# Patient Record
Sex: Male | Born: 1935 | Race: White | Hispanic: No | Smoking: Never smoker
Health system: Southern US, Community
[De-identification: ages and names within clinical notes are randomized; demographics above are authoritative.]

## PROBLEM LIST (undated history)

## (undated) DIAGNOSIS — M545 Low back pain, unspecified: Secondary | ICD-10-CM

## (undated) DIAGNOSIS — M1990 Unspecified osteoarthritis, unspecified site: Secondary | ICD-10-CM

## (undated) DIAGNOSIS — G5600 Carpal tunnel syndrome, unspecified upper limb: Secondary | ICD-10-CM

## (undated) DIAGNOSIS — N200 Calculus of kidney: Secondary | ICD-10-CM

## (undated) DIAGNOSIS — Z8669 Personal history of other diseases of the nervous system and sense organs: Secondary | ICD-10-CM

## (undated) DIAGNOSIS — I2510 Atherosclerotic heart disease of native coronary artery without angina pectoris: Secondary | ICD-10-CM

## (undated) DIAGNOSIS — J668 Airway disease due to other specific organic dusts: Secondary | ICD-10-CM

## (undated) DIAGNOSIS — E7800 Pure hypercholesterolemia, unspecified: Secondary | ICD-10-CM

## (undated) DIAGNOSIS — R7611 Nonspecific reaction to tuberculin skin test without active tuberculosis: Secondary | ICD-10-CM

## (undated) DIAGNOSIS — J302 Other seasonal allergic rhinitis: Secondary | ICD-10-CM

## (undated) DIAGNOSIS — I10 Essential (primary) hypertension: Secondary | ICD-10-CM

## (undated) DIAGNOSIS — C4490 Unspecified malignant neoplasm of skin, unspecified: Secondary | ICD-10-CM

## (undated) HISTORY — DX: Essential (primary) hypertension: I10

## (undated) HISTORY — DX: Airway disease due to other specific organic dusts: J66.8

## (undated) HISTORY — DX: Calculus of kidney: N20.0

## (undated) HISTORY — DX: Other seasonal allergic rhinitis: J30.2

## (undated) HISTORY — DX: Nonspecific reaction to tuberculin skin test without active tuberculosis: R76.11

## (undated) HISTORY — PX: CORONARY ARTERY BYPASS GRAFT: SHX141

## (undated) HISTORY — DX: Atherosclerotic heart disease of native coronary artery without angina pectoris: I25.10

## (undated) HISTORY — DX: Unspecified malignant neoplasm of skin, unspecified: C44.90

## (undated) HISTORY — DX: Low back pain, unspecified: M54.50

## (undated) HISTORY — DX: Unspecified osteoarthritis, unspecified site: M19.90

## (undated) HISTORY — DX: Pure hypercholesterolemia, unspecified: E78.00

## (undated) HISTORY — DX: Carpal tunnel syndrome, unspecified upper limb: G56.00

## (undated) HISTORY — DX: Low back pain: M54.5

## (undated) HISTORY — DX: Personal history of other diseases of the nervous system and sense organs: Z86.69

---

## 1998-01-23 ENCOUNTER — Inpatient Hospital Stay (HOSPITAL_COMMUNITY): Admission: EM | Admit: 1998-01-23 | Discharge: 1998-01-29 | Payer: Self-pay | Admitting: *Deleted

## 1998-01-27 ENCOUNTER — Encounter: Payer: Self-pay | Admitting: Cardiothoracic Surgery

## 1998-01-28 ENCOUNTER — Encounter: Payer: Self-pay | Admitting: Cardiothoracic Surgery

## 2001-05-14 ENCOUNTER — Encounter: Admission: RE | Admit: 2001-05-14 | Discharge: 2001-05-14 | Payer: Self-pay | Admitting: Orthopedic Surgery

## 2001-05-14 ENCOUNTER — Encounter: Payer: Self-pay | Admitting: Orthopedic Surgery

## 2004-06-19 ENCOUNTER — Ambulatory Visit: Payer: Self-pay | Admitting: *Deleted

## 2004-06-21 ENCOUNTER — Ambulatory Visit: Payer: Self-pay | Admitting: Cardiology

## 2004-08-16 ENCOUNTER — Ambulatory Visit: Payer: Self-pay | Admitting: Internal Medicine

## 2004-10-11 ENCOUNTER — Ambulatory Visit: Payer: Self-pay | Admitting: Cardiovascular Disease

## 2005-09-24 ENCOUNTER — Encounter: Payer: Self-pay | Admitting: *Deleted

## 2006-01-15 ENCOUNTER — Ambulatory Visit: Payer: Self-pay | Admitting: Pulmonary Disease

## 2006-02-10 ENCOUNTER — Ambulatory Visit: Payer: Self-pay | Admitting: *Deleted

## 2006-05-07 ENCOUNTER — Ambulatory Visit: Payer: Self-pay | Admitting: Pulmonary Disease

## 2006-11-03 ENCOUNTER — Ambulatory Visit: Payer: Self-pay | Admitting: Pulmonary Disease

## 2006-11-03 LAB — CONVERTED CEMR LAB
Albumin: 4.1 g/dL (ref 3.5–5.2)
Alkaline Phosphatase: 61 units/L (ref 39–117)
BUN: 19 mg/dL (ref 6–23)
Basophils Absolute: 0 10*3/uL (ref 0.0–0.1)
Cholesterol: 303 mg/dL (ref 0–200)
Direct LDL: 184.5 mg/dL
GFR calc Af Amer: 93 mL/min
Hemoglobin: 17.1 g/dL — ABNORMAL HIGH (ref 13.0–17.0)
Lymphocytes Relative: 19.1 % (ref 12.0–46.0)
MCHC: 33.7 g/dL (ref 30.0–36.0)
Monocytes Absolute: 0.4 10*3/uL (ref 0.2–0.7)
Monocytes Relative: 6.7 % (ref 3.0–11.0)
Neutro Abs: 4.6 10*3/uL (ref 1.4–7.7)
Potassium: 4.2 meq/L (ref 3.5–5.1)
TSH: 0.78 microintl units/mL (ref 0.35–5.50)
Total Protein: 7.1 g/dL (ref 6.0–8.3)

## 2006-11-30 ENCOUNTER — Ambulatory Visit: Payer: Self-pay | Admitting: Cardiovascular Disease

## 2007-01-18 ENCOUNTER — Ambulatory Visit: Payer: Self-pay | Admitting: Cardiology

## 2007-01-18 LAB — CONVERTED CEMR LAB
ALT: 16 units/L (ref 0–53)
AST: 23 units/L (ref 0–37)
Albumin: 3.9 g/dL (ref 3.5–5.2)
HDL: 37.8 mg/dL — ABNORMAL LOW (ref >39.0)
Total Bilirubin: 1.4 mg/dL — ABNORMAL HIGH (ref 0.3–1.2)
Triglycerides: 102 mg/dL (ref 0–149)

## 2007-01-28 ENCOUNTER — Ambulatory Visit: Payer: Self-pay | Admitting: Cardiology

## 2007-03-05 DIAGNOSIS — I25810 Atherosclerosis of coronary artery bypass graft(s) without angina pectoris: Secondary | ICD-10-CM

## 2007-03-05 DIAGNOSIS — M545 Low back pain: Secondary | ICD-10-CM

## 2007-03-05 DIAGNOSIS — I10 Essential (primary) hypertension: Secondary | ICD-10-CM | POA: Insufficient documentation

## 2007-03-05 DIAGNOSIS — M1990 Unspecified osteoarthritis, unspecified site: Secondary | ICD-10-CM | POA: Insufficient documentation

## 2007-03-05 DIAGNOSIS — E7800 Pure hypercholesterolemia, unspecified: Secondary | ICD-10-CM

## 2007-03-05 DIAGNOSIS — Z951 Presence of aortocoronary bypass graft: Secondary | ICD-10-CM | POA: Insufficient documentation

## 2007-05-03 ENCOUNTER — Ambulatory Visit: Payer: Self-pay | Admitting: Cardiology

## 2007-05-03 LAB — CONVERTED CEMR LAB
ALT: 19 units/L (ref 0–53)
AST: 25 units/L (ref 0–37)
Alkaline Phosphatase: 52 units/L (ref 39–117)
Bilirubin, Direct: 0.1 mg/dL (ref 0.0–0.3)
Cholesterol: 223 mg/dL (ref 0–200)
VLDL: 28 mg/dL (ref 0–40)

## 2007-05-05 ENCOUNTER — Ambulatory Visit: Payer: Self-pay | Admitting: Pulmonary Disease

## 2007-05-05 DIAGNOSIS — C44702 Unspecified malignant neoplasm of skin of right lower limb, including hip: Secondary | ICD-10-CM

## 2007-05-05 DIAGNOSIS — G5600 Carpal tunnel syndrome, unspecified upper limb: Secondary | ICD-10-CM | POA: Insufficient documentation

## 2007-05-05 DIAGNOSIS — J668 Airway disease due to other specific organic dusts: Secondary | ICD-10-CM | POA: Insufficient documentation

## 2007-05-06 ENCOUNTER — Ambulatory Visit: Payer: Self-pay | Admitting: Cardiology

## 2007-07-23 ENCOUNTER — Ambulatory Visit: Payer: Self-pay | Admitting: Cardiology

## 2007-07-23 LAB — CONVERTED CEMR LAB
AST: 28 units/L (ref 0–37)
Albumin: 3.8 g/dL (ref 3.5–5.2)
Bilirubin, Direct: 0.2 mg/dL (ref 0.0–0.3)
LDL Cholesterol: 117 mg/dL — ABNORMAL HIGH (ref 0–99)
Total CHOL/HDL Ratio: 4.3
Triglycerides: 117 mg/dL (ref 0–149)
VLDL: 23 mg/dL (ref 0–40)

## 2007-07-29 ENCOUNTER — Ambulatory Visit: Payer: Self-pay | Admitting: Cardiovascular Disease

## 2008-01-10 ENCOUNTER — Ambulatory Visit: Payer: Self-pay | Admitting: Internal Medicine

## 2008-01-10 LAB — CONVERTED CEMR LAB
ALT: 15 units/L (ref 0–53)
Albumin: 4 g/dL (ref 3.5–5.2)
Cholesterol: 176 mg/dL (ref 0–200)
HDL: 36.9 mg/dL — ABNORMAL LOW (ref >39.0)
LDL Cholesterol: 110 mg/dL — ABNORMAL HIGH (ref 0–99)
Total Protein: 6.9 g/dL (ref 6.0–8.3)
Triglycerides: 146 mg/dL (ref 0–149)
VLDL: 29 mg/dL (ref 0–40)

## 2008-01-13 ENCOUNTER — Ambulatory Visit: Payer: Self-pay | Admitting: Cardiology

## 2008-01-13 LAB — CONVERTED CEMR LAB
Basophils Absolute: 0 10*3/uL (ref 0.0–0.1)
Basophils Relative: 0.9 % (ref 0.0–3.0)
Hemoglobin: 16.3 g/dL (ref 13.0–17.0)
Lymphocytes Relative: 20.2 % (ref 12.0–46.0)
Monocytes Relative: 7 % (ref 3.0–12.0)
Neutro Abs: 3.8 10*3/uL (ref 1.4–7.7)
Neutrophils Relative %: 71 % (ref 43.0–77.0)
RBC: 5.03 M/uL (ref 4.22–5.81)

## 2008-02-08 ENCOUNTER — Inpatient Hospital Stay (HOSPITAL_COMMUNITY): Admission: EM | Admit: 2008-02-08 | Discharge: 2008-02-10 | Payer: Self-pay | Admitting: Emergency Medicine

## 2008-07-12 ENCOUNTER — Ambulatory Visit: Payer: Self-pay | Admitting: Cardiology

## 2008-07-12 LAB — CONVERTED CEMR LAB
ALT: 15 units/L (ref 0–53)
Bilirubin, Direct: 0.2 mg/dL (ref 0.0–0.3)
Cholesterol: 168 mg/dL (ref 0–200)
HDL: 40.5 mg/dL (ref >39.0)
LDL Cholesterol: 108 mg/dL — ABNORMAL HIGH (ref 0–99)
Total Bilirubin: 1 mg/dL (ref 0.3–1.2)
Total Protein: 7 g/dL (ref 6.0–8.3)
VLDL: 20 mg/dL (ref 0–40)

## 2008-07-17 ENCOUNTER — Ambulatory Visit: Payer: Self-pay | Admitting: Cardiology

## 2009-01-01 ENCOUNTER — Ambulatory Visit: Payer: Self-pay | Admitting: Internal Medicine

## 2009-01-01 LAB — CONVERTED CEMR LAB
AST: 24 units/L (ref 0–37)
Albumin: 4 g/dL (ref 3.5–5.2)
Alkaline Phosphatase: 55 units/L (ref 39–117)
Cholesterol: 166 mg/dL (ref 0–200)
HDL: 36.7 mg/dL — ABNORMAL LOW (ref >39.00)
Total CHOL/HDL Ratio: 5
Total Protein: 7.3 g/dL (ref 6.0–8.3)
Triglycerides: 125 mg/dL (ref 0.0–149.0)

## 2009-01-04 ENCOUNTER — Ambulatory Visit: Payer: Self-pay | Admitting: Internal Medicine

## 2009-06-21 ENCOUNTER — Ambulatory Visit: Payer: Self-pay | Admitting: Internal Medicine

## 2009-06-21 LAB — CONVERTED CEMR LAB
ALT: 15 units/L (ref 0–53)
AST: 23 units/L (ref 0–37)
Alkaline Phosphatase: 53 units/L (ref 39–117)
Bilirubin, Direct: 0.1 mg/dL (ref 0.0–0.3)
Cholesterol: 164 mg/dL (ref 0–200)
Total Bilirubin: 0.8 mg/dL (ref 0.3–1.2)
Total Protein: 7.2 g/dL (ref 6.0–8.3)
VLDL: 34 mg/dL (ref 0.0–40.0)

## 2009-06-28 ENCOUNTER — Ambulatory Visit: Payer: Self-pay | Admitting: Cardiology

## 2009-12-24 ENCOUNTER — Ambulatory Visit: Payer: Self-pay | Admitting: Internal Medicine

## 2009-12-24 LAB — CONVERTED CEMR LAB
ALT: 12 units/L (ref 0–53)
Albumin: 4.1 g/dL (ref 3.5–5.2)
Cholesterol: 190 mg/dL (ref 0–200)
HDL: 41.3 mg/dL (ref >39.00)
Total CHOL/HDL Ratio: 5
Total Protein: 6.8 g/dL (ref 6.0–8.3)
Triglycerides: 142 mg/dL (ref 0.0–149.0)

## 2009-12-27 ENCOUNTER — Ambulatory Visit: Payer: Self-pay | Admitting: Cardiovascular Disease

## 2010-06-25 NOTE — Assessment & Plan Note (Signed)
Summary: lpr..mp   Visit Type:  Follow-up  CC:  dyslipidemia follow-up.  History of Present Illness:  Lipid Clinic Visit      The patient comes in today for dyslipidemia follow-up.  The patient has no history of chest pain, shortness of breath, muscle aches, and muscle cramps.  Dietary compliance review reveals an overall grade of eating 5 or more fruits and vegetables and limiting fats and TFA's.  Risk factor modification shows that the patient is exercising by push mowing multiple family members yards, and cutting wood for neighbors.  Compliance with medication is good.     Lipid Management Provider  Eda Keys, PharmD  Current Medications (verified): 1)  Multivitamins   Tabs (Multiple Vitamin) .... Take 1 Tablet By Mouth Once A Day 2)  Crestor 20 Mg  Tabs (Rosuvastatin Calcium) .... Take 1 Tablet Every Other Day  Allergies (verified): No Known Drug Allergies   Vital Signs:  Patient profile:   77 year old male Height:      71 inches BP sitting:   132 / 82  Impression & Recommendations:  Problem # 1:  HYPERCHOLESTEROLEMIA (ICD-272.0)  His updated medication list for this problem includes:    Crestor 20 Mg Tabs (Rosuvastatin calcium) .Marland Kitchen... Take 1 tablet every other day  Mr Dollar returns to lipid clinic with no compalints.  He is comlpiant with meds and an low fat diet.  He eats mostly baked chicken and fish and occassionally it is fried.  He eats lots of vegies as well.  He does not have a set exercise routine, however, his exercise is push mowing several family members and his churches grass.  Each being failry large lots.  He also chops wood for himself and some neighbors.  He is "always on the go"  at 77yo.    TC 164  at goal < 200   TG 170 > goal < 150 and higher than last at 125,   LDL 86  at goal < 100   HDL 44 at goal > 40  Plan to decrease CHO and continue exercise.  Will monitor TG closely and f/u in 6 months.  Patient did not wish to start fish oil at this time,  but will consider adding this at next visit if TG remain elevated.

## 2010-06-25 NOTE — Assessment & Plan Note (Signed)
Summary: rov/eac   Visit Type:  Follow-up  CC:  dyslipidemia follow-up.  History of Present Illness:  Lipid Clinic Visit      The patient comes in today for dyslipidemia follow-up.  The patient has no complaints of medication problems, chest pain, muscle aches, and muscle cramps.  He is currently compliant with his Crestor 20mg  every other day.  Dietary compliance review reveals pt following a low-fat, low-cholesterol diet.  He eats mostly chicken, vegetables and occassionally potato salad.  Review of exercise habits reveals that the patient continues to push mow his 1 1/2-2 acre yard and chop wood for him and his relatives.  He does not smoke or drink alcohol.     Lipid Management Provider  Weston Brass, PharmD  Current Medications (verified): 1)  Multivitamins   Tabs (Multiple Vitamin) .... Take 1 Tablet By Mouth Once A Day 2)  Crestor 20 Mg  Tabs (Rosuvastatin Calcium) .... Take 1 Tablet Every Other Day  Allergies (verified): No Known Drug Allergies   Vital Signs:  Patient profile:   77 year old male Height:      71 inches Weight:      149.50 pounds BMI:     20.93 BP sitting:   142 / 86 Cuff size:   regular  Impression & Recommendations:  Problem # 1:  HYPERCHOLESTEROLEMIA (ICD-272.0) Pt's current cholesterol is TC- 190  (goal<200), TG 142 (goal<150), HDL 41 (goal>40), and LDL 253 (goal<70).  AST and ALT are WNL.  His TG have improved since last visit but LDL is still above goal.  Will not increase Crestor at this time but if LDL remains elevated at next visit will need to incrse Crestor.  He currently has a healthy diet and maintains a very active lifestyle given his age.  Encouraged him to continue to be as active as possible.  Will recheck cholesterol in 6 months.   His updated medication list for this problem includes:    Crestor 20 Mg Tabs (Rosuvastatin calcium) .Marland Kitchen... Take 1 tablet every other day  Patient Instructions: 1)  Continue to take Crestor 20mg  every other  day 2)  Continue low-cholesterol diet 3)  Try to stay as active as possible by mowing yards and chopping woods 4)  Lab work: 07/01/2009 at 8:30 am (fasting) 5)  Lipid Clinic Appt: 07/04/2009 at 10:00 am

## 2010-07-01 ENCOUNTER — Other Ambulatory Visit: Payer: Self-pay

## 2010-07-02 ENCOUNTER — Other Ambulatory Visit (INDEPENDENT_AMBULATORY_CARE_PROVIDER_SITE_OTHER): Payer: PRIVATE HEALTH INSURANCE

## 2010-07-02 ENCOUNTER — Other Ambulatory Visit: Payer: Self-pay

## 2010-07-02 ENCOUNTER — Encounter (INDEPENDENT_AMBULATORY_CARE_PROVIDER_SITE_OTHER): Payer: Self-pay | Admitting: *Deleted

## 2010-07-02 DIAGNOSIS — E7800 Pure hypercholesterolemia, unspecified: Secondary | ICD-10-CM

## 2010-07-02 DIAGNOSIS — Z79899 Other long term (current) drug therapy: Secondary | ICD-10-CM

## 2010-07-02 LAB — HEPATIC FUNCTION PANEL
ALT: 15 U/L (ref 0–53)
AST: 25 U/L (ref 0–37)
Albumin: 4.2 g/dL (ref 3.5–5.2)
Alkaline Phosphatase: 59 U/L (ref 39–117)
Total Protein: 7 g/dL (ref 6.0–8.3)

## 2010-07-02 LAB — LIPID PANEL
Cholesterol: 169 mg/dL (ref 0–200)
VLDL: 28.4 mg/dL (ref 0.0–40.0)

## 2010-07-04 ENCOUNTER — Ambulatory Visit: Payer: Self-pay

## 2010-10-08 NOTE — Assessment & Plan Note (Signed)
[facility]                               LIPID CLINIC NOTE   NAME:[name], [name]                         MRN:          [id number]  DATE:07/17/2008                            DOB:          Sep 01, 1925    The patient is seen in Lipid Clinic for further evaluation, medication  titration associated with his hyperlipidemia in the setting of  documented coronary disease .  The patient is feeling and doing well  overall.  He has been using medication calendar to remember.  He has  been tolerating his meds and taking them every other day, the Crestor  because he is intolerant to daily therapy.  His daughter cooks some  pork, beef, and other red meats.  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  He is walking or doing  something physical each day.  He did break his leg, which he was knocked  over when he was cutting a tree down 3 or 4 months ago.  He is wearing  compression stockings and continues to complain of arthritis pain.  He  has had an 8-pound weight loss.   PHYSICAL EXAMINATION:  The patient's weight is 151 pounds, blood  pressure is 140/78.  He was talking at the time of blood pressure  reading and 72 is the heart rate.   Labs on July 12, 2008, revealed total cholesterol 168, triglyceride  98, HDL 40.5, LDL 108.  LFTs are within normal limits.   ASSESSMENT:  The patient is a very active 77 year old male, on Crestor  20 mg every other day due to intolerance of daily therapy.  Lipid panel  is good except for slight elevation in LDL.  He seems compliant and has  a system to remember his medications.  He will continue Crestor 20 mg  every other day; take Tylenol 500 one or two tablets every 6-8 hours for  joint pain.  He will call with questions or problems in the meantime.  Meg Conger, Pharm. D  saw the patient in visit today.      Shelby Dubin, PharmD, BCPS, CPP  Electronically Signed      Rollene Rotunda, MD,  [facility]  Electronically Signed   MP/MedQ  DD: 08/03/2008  DT: 08/03/2008  Job #: [id number]   cc:   Veverly Fells. Excell Seltzer, MD

## 2010-10-08 NOTE — Assessment & Plan Note (Signed)
[facility]                               LIPID CLINIC NOTE   NAME:[name], [name]                         MRN:          [id number]  DATE:07/29/2007                            DOB:          [birth year]    Return office visit for lipid clinic.   PAST MEDICAL HISTORY:  Hyperlipidemia, coronary artery disease status  post acute myocardial infarction and a CABG x3, hypertension.   MEDICATIONS:  1. Folic acid 1 mg daily.  2. Multivitamin daily.  3. Aspirin 81 mg daily.  4. Crestor 20 mg every other day.   VITAL SIGNS:  Weight 159 pounds, blood pressure 138/78, heart rate 70.   LABORATORY DATA:  Total cholesterol 183, triglyceride 117, HDL 42, LDL  117.  LFTs within normal limits.   ASSESSMENT:  [name] is a very chatty pleasant 77 year old gentleman  who returns to the Lipid Clinic today with no chest pain, no shortness  of breath, no muscle aches or pains.  He is compliant with his current  medication regimen.  He is having no problems after the increase of  Crestor from 10 mg every other day to 20 mg every other day.  He is very  resistant to increase this to a daily dose because, in the past, he has  had myalgias so severe that he is not able to do his activities of daily  living when taking a statin medication on a daily basis.  His total  cholesterol is at goal of less than 200, triglycerides at goal of less  than 150, HDL goal of greater than 40, LDL improved.  However, still  slightly above goal of less than 100.  [name] does exercise on a  daily basis by push-mowing lawns.  He push-mows his yard which is  approximately 5 acres.  He also does his grandchildren's yards, which  are several acres a piece, and his daughters' and sister-in-law's, who  all seem to live within a mile of him and on several acres of land.  He  also cuts wood and delivers it once a week to his sister-in-law.  He  follows a fairly low-fat diet.  However, he does do  snacking throughout  the day and in the evening.  He is very resistant to change and, no  matter what has been discussed regarding diet in the past, he is very  reluctant to make those changes once he is home.  Given his remote  history of MI and heart disease, and his reluctance to take medications  and change his diet, I have continued to encourage low-fat, low-  cholesterol diet.  We have talked about healthier alternatives to him of  the snacks he is having in the evening, and I have encouraged him to  continue his exercise regimen.   PLAN:  1. Make no changes.  Continue Crestor 20 mg every other day.  2. Continue exercise and low-fat diet.  3. Followup visit in 6 months for LFTs and lipid panel, and make      adjustments that will be  needed at that time.      Leota Sauers, PharmD  Electronically Signed      [name], MD, [facility]  Electronically Signed   LC/MedQ  DD: 07/29/2007  DT: 07/29/2007  Job #: [id number]

## 2010-10-08 NOTE — Assessment & Plan Note (Signed)
[facility]                               LIPID CLINIC NOTE   NAME:Sepulveda, JUDGE DUQUE                         MRN:          [id number]  DATE:11/30/2006                            DOB:          [birth year]    [name] is a very pleasant patient known to me from previous lipid  clinic visits associated with the care of Dr. Kriste Basque.  Mr. Bethel has been  feeling and doing well.  He states that he stopped his Crestor 20 mg  daily some time ago and has not been able to tolerate it since that time  due to some diffuse myalgias.  The patient remains active in his yard-  mowing activities.  He has had no weakness or pain since discontinuing  this therapy other than normal age-related arthritic changes per his  report.  He follows a relatively low-fat, low-cholesterol diet.   PAST MEDICAL HISTORY:  Is pertinent for hypercholesterolemia, documented  coronary disease with history of bypass grafting for three-vessel  coronary artery disease, hypertension.   CURRENT MEDICATIONS:  1. Folic acid daily.  2. Multivitamin daily.  3. Aspirin 81 mg daily.   REVIEW OF SYSTEMS:  As stated in the HPI and otherwise negative.   PHYSICAL EXAMINATION:  Weight today in the office is 153 pounds, blood  pressure is 110/78, heart rate is 68, respirations are 17.   LABORATORIES:  From November 03, 2006, reveal glucose of 100, normal liver  function tests, and total cholesterol of 303, triglyceride 224, HDL  36.8, LDL directly measured 184.5, TSH is within normal limits.   ASSESSMENT:  The patient has had myalgias in the past but states that he  is willing to rechallenge with therapies, particularly if low dose or  combination dose can be attempted.   PLAN:  The patient will try taking a half a tablet of Crestor 20 every-  other day as he has 2 months of this therapy at home.  He will call with  questions or problems in the meantime.  If he has muscle aches or pains  he understands that  he is to come in for blood work that day, and call  with questions or problems in the meantime.   Time spent with patient is 20 minutes.   I appreciate the opportunity to see this kind gentleman.      Shelby Dubin, PharmD, BCPS, CPP  Electronically Signed      Rollene Rotunda, MD, [facility]  Electronically Signed   MP/MedQ  DD: 12/01/2006  DT: 12/02/2006  Job #: [id number]   cc:   Lonzo Cloud. Kriste Basque, MD  Rubye Oaks, NP

## 2010-10-08 NOTE — Op Note (Signed)
NAMEJSEAN, TAUSSIG NO.:  [id number]   MEDICAL RECORD NO.:  [id number]          PATIENT TYPE:  INP   LOCATION:  5001                         FACILITY:  [facility]   PHYSICIAN:  Mark C. Ophelia Charter, M.D.    DATE OF BIRTH:  05/17/1926   DATE OF PROCEDURE:  02/08/2008  DATE OF DISCHARGE:                               OPERATIVE REPORT   PREOPERATIVE DIAGNOSIS:  Left tibia and fibula fracture.   POSTOPERATIVE DIAGNOSIS:  Left tibia and fibula fracture.   PROCEDURE:  Left tibial interlocking nail.   SURGEON:  Mark C. Ophelia Charter, MD   ANESTHESIA:  GOT.   COMPONENTS USED:  A 10-mm DePuy tibial nail with proximal and distal  interlocks.   TOURNIQUET TIME:  48 minutes.   DRAINS:  None.   DESCRIPTION OF PROCEDURE:  After induction of general anesthesia,  proximal thigh tourniquet, standard prepping and draping, impervious  stockinette stopped at the ankle, usual split sheets and drapes.  A  surgical safety checklist was completed including time out.  Images were  displayed.  Ancef was given prophylactically.  Incision was made along  the medial aspect of the patellar tendon.  Subperiosteal dissection down  the cortex was performed, and a pin was drilled into the tibia and  checked under fluoroscopy, AP and lateral with good position.  Overreaming and then placement of guide rod down across the fracture  down to the old growth plate.  Confirmation of this down the canal with  rotation, AP and lateral and then sequential reaming up to 11 mm and  placement of 10-mm nail.  The 36.5 nail length was selected and the nail  length measured about 38 cm.  The nail was countersunk until the tip was  at the old growth plate.  Proximal interlocks were placed with a guide  from medial to lateral for rotational stability, both static.  Distal  static screws were locked with the medial to lateral, placed with a  freehand technique, and then an anterior-posterior screw.  Anterior-  posterior  screw was placed by making small incision, blunt spreading  next to the anterior tib tendon, and then careful protection of soft  tissue as the hole was drilled, screw was placed.  All wounds were  irrigated, tourniquet was deflated.  Zero Vicryl placed in the  superficial fascia.  The knee joint synovium was never opened and 2-0  Vicryl was placed subcutaneous tissue and skin staple closure.  Staple  closure of the 4 interlock incisions, 2 proximal and 2 distal and then  Marcaine infiltration in the skin, 4 x 4's, Webril, Ace wrap, and soft  tissue dressing.  Instrument count and needle count was correct.     Mark C. Ophelia Charter, M.D.  Electronically Signed    MCY/MEDQ  D:  02/08/2008  T:  02/09/2008  Job:  [id number]

## 2010-10-08 NOTE — Assessment & Plan Note (Signed)
[facility]                               LIPID CLINIC NOTE   NAME:[name] [name] GRACA                         MRN:          [id number]  DATE:05/06/2007                            DOB:          09-19-1925    Return office visit for lipid clinic.   PAST MEDICAL HISTORY:  Hyperlipidemia, coronary artery disease status  post acute myocardial infarction and a CABG x3, and hypertension.   CURRENT MEDICATIONS:  1. Folic acid 1 mg daily.  2. Multivitamin daily.  3. Aspirin 81 mg daily.  4. Crestor 20 mg every other day.   VITAL SIGNS:  Weight 159 pounds, blood pressure 120/72, heart rate 70.   LABORATORY DATA:  Total cholesterol 223, triglyceride 139, HDL 42.9, LDL  136.  LFTs within normal limits.   ASSESSMENT:  [name] [name] is a very pleasant 77 year old gentleman who push-  mows his 5 acre yard and his daughter's yard, and his church and  cemetery as his daily exercise.  He also rakes leaves in all of these  places.  He is a very active gentleman, although he does not have an  exercise regimen per se, push mowing all of this does count.  He is  supposed to be taking his Crestor 20 mg every other day.  However, over  the last several months or several weeks has been taking 10 mg every  other day.  He was confused by the dose he was supposed to be taking,  and thought he was supposed to take 1/2 a tablet every other day.  This  could be the reason for his increase in LDL from 107 to 146 since the  last visit in September of 2008.  Upon discussion, he is willing to go  back up to the 20 mg every other day and we will follow up with a phone  call in 2 weeks to ensure he has been compliant with this increase, and  he is not having any myalgias from this.  He, in the past, has had  myalgias from 20 mg of Crestor every day.  Total cholesterol is greater  than goal of less than 200, triglycerides are at goal of less than 150.  HDL goal is greater than 40 and LDL is  greater than goal of less than  100.  His diet for his main meals is fairly low fat and low  carbohydrate.  He does not eat fried food.  However, he does seem to be  snacking more now that he has been inside in the winter months.  He  grazes on cookies and other sweet snacks that are around in his house.  I have encouraged him to decrease from 6 to 3 cookies a day, or 3 sweets  a day, in the hopes that this will help decrease his LDL as well.   PLAN:  1. Increase Crestor to 20 mg every other day.  2. Continue exercise.  3. Continue low fat diet.  4. Followup visit in 3 months with lipid panel and LFTs, and we will  make adjustments at that time.      Leota Sauers, PharmD  Electronically Signed      [name], MD, [facility]  Electronically Signed   LC/MedQ  DD: 05/06/2007  DT: 05/06/2007  Job #: [id number]

## 2010-10-08 NOTE — Assessment & Plan Note (Signed)
[facility]                               LIPID CLINIC NOTE   NAME:[name]                         MRN:          [id number]  DATE:01/13/2008                            DOB:          [birth year]    PAST MEDICAL HISTORY:  Hyperlipidemia, hypertension, coronary artery  disease, status post acute myocardial infarction, and CABG x3.   MEDICATIONS:  1. Folic acid 1 g daily.  2. Multivitamin daily.  3. Aspirin 81 mg daily.  4. Crestor 20 mg every other day.   PHYSICAL EXAMINATION:  VITAL SIGNS:  Weight 157 pounds, blood pressure  130/74, and heart rate 54.   LABORATORY DATA:  Total cholesterol 176, triglycerides 146, HDL 36.4 and  LDL of 110, and LFTs within normal limits.   ASSESSMENT:  [name] is a very pleasant 77 year old gentleman who  continues to amaze me in the physical activity that he partakes on a  daily basis.  He regularly push almost 3 acres of his granddaughter's  lawn and his own lawn as his exercise for the week.  He has just  recently given up about 3 or 4 acres of his church lawn that he had  previously been mowing, seen that was a little bit too much for him to  push mower in this hot weather, keep in mind this gentleman is 77 years  old.  He is very active and cleaning up the yard as well at his  granddaughter's house, and she had a tree limb fall this week that he  took the chain saw down and cut up into blocks to use for firewood for  the winter.  He continues to eat a low-fat and low-carbohydrate meal.  His daughter usually makes meals for him or his sister-in-law where he  frequents for his evening meals.  He also goes out to eat with friends,  but states that he makes low-fat and low-carbohydrate choices for his  meals.  He does seem to snack occasionally, but we have on several  occasions talked about and encouraged healthy alternatives.  He does not  eat any red meat and states that he tries to eat healthy.  His  total  cholesterol is at goal of less than 200, triglycerides although less  than goal of less than 150 are slightly elevated since last visit.  His  HDL is less than goal of greater than 40, in the significant decrease  from last visit from 42, and his LDL is greater than goal of less than  70.  However, he has had myalgias to higher doses of statin therapy, and  therefore we will not push the issue at this time.  We have encouraged  him to continue with his vegetables and lean meats, and to decrease some  of the sweets in his diet, have encouraged him to continue his walking  and his mowing as his exercise.  Of note today, he does seem to have  cold painful hands bilaterally.  He feels that this is due to arthritis.  We were concerned that it  was anemic because he has increased his  aspirin from 81 to 325 mg to help with his arthritis pain.  However, his  H and H did come back at 16 and 46, so I would assume  therefore, his  painful hands is not due to anemia.  We have encouraged him to follow up  with the primary physician if the pain persists.   PLAN:  1. Continue current medication.  2. Decrease fats in diet.  3. Continue exercise.  4. Followup visit in 6 months for lipid panel and LFTs and will make      changes at that time.      Leota Sauers, PharmD  Electronically Signed      [name], MD, [facility]  Electronically Signed   LC/MedQ  DD: 01/13/2008  DT: 01/14/2008  Job #: [id number]

## 2010-10-08 NOTE — Assessment & Plan Note (Signed)
[facility]                               LIPID CLINIC NOTE   NAME:[name], [name] REALE                         MRN:          [id number]  DATE:01/28/2007                            DOB:          [birth year]    PAST MEDICAL HISTORY:  1. Hyperlipidemia.  2. Coronary artery disease status post acute myocardial infarction      with 3-vessel coronary artery bypass graft.  3. Hypertension.   CURRENT MEDICATIONS:  1. Folic acid 1 mg daily.  2. Multivitamin daily.  3. Aspirin 81 mg daily.  4. Crestor 20 mg every other day.   VITAL SIGNS:  Weight 150 pounds, blood pressure 118/70, heart rate 70.   LABORATORY DATA:  Total cholesterol 165, triglyceride 102, HDL 37.8, LDL  107.  LFTs within normal limits.  Total bili 1.4.   ASSESSMENT:  Mr.  [name] is a very pleasant and chatty 77 year old male  who has no chest pain, no shortness of breath, no muscle aches or pains.  His only complaint today is some arthritis pain in his right shoulder  and right hip which he says he tolerates, does not take any medication  for and it has been discerned that this does not have any correlation  with statin medication.  He is compliant with medication regimen.  He  follows a relatively low-fat, low-cholesterol diet with some snacking  but his activity and his exercise is yard mowing.  He push mows his yard  for an hour every day.  He lives on about 5 acres and his yard does not  need mowing he goes to his daughter's house where he push mows her yard.  His total cholesterol is at goal of less than 200, triglycerides at goal  of less than 150, HDL is at less than goal of greater than 40 but has  increased since last visit, his LDL is greater than goal of greater then  100 however a significant drop since last visit.  Non HDL is slightly  above goal of less than 100.   PLAN:  1. Continue current medication regimen.  2. Continue heart healthy diet and push mowing yard, however I  have      encouraged him to not over exert himself and to drink plenty of      water while he is mowing.  3. Followup visit in 3 months for lipid panel and LFTs, will make      adjustments at that time based on lab results.      Leota Sauers, PharmD  Electronically Signed      [name], MD, [facility]  Electronically Signed   LC/MedQ  DD: 01/28/2007  DT: 01/28/2007  Job #: [id number]

## 2010-10-11 NOTE — Assessment & Plan Note (Signed)
[facility]                              CARDIOLOGY OFFICE NOTE   NAME:[name], [name]                         MRN:          [id number]  DATE:02/10/2006                            DOB:          1926-03-17    The patient is a very pleasant, 77 year old white male, 8 years postop CABG  for 3-vessel cardiac disease by Dr. Sheliah Plane.  The patient has been  unable to tolerate Crestor or Zetia and discontinued these a couple of years  ago.  The patient states that he has been feeling fine.  He has had no  cardiac symptoms.   He has a history of hypertension.   Previous ultrasound revealed no evidence of abdominal aortic aneurysm.   MEDICATIONS:  Folic acid, multivitamin, aspirin 81 mg.  He had been on  atenolol but discontinued this, recently I believe, per Dr. Kriste Basque.   PHYSICAL EXAMINATION:  Blood pressure 124/72, pulse 83, normal sinus rhythm.  Temperature is normal.  JVP is not elevated.  Carotid pulses are palpable and equal without bruits.  LUNGS:  Clear.  CARDIAC EXAM:  S4.  ABDOMINAL EXAM:  Unremarkable.  Liver, spleen, kidneys not palpable.  No  masses.  EXTREMITIES:  Without edema.   EKG:  Reveals minor nonspecific T changes.   IMPRESSION/DIAGNOSES:  1. Eight years post coronary artery bypass graft, 3-vessel coronary      disease--asymptomatic.  2. Hyperlipidemia intolerant to therapy.  3. Hypertension controlled.   PLAN:  We plan to see the patient back in 6 months.  Followup lipids at that  time.                              Cecil Cranker, MD, [facility]    EJL/MedQ  DD:  02/10/2006  DT:  02/11/2006  Job #:  [id number]

## 2011-02-24 LAB — COMPREHENSIVE METABOLIC PANEL
ALT: 19
AST: 30
Albumin: 3.8
Alkaline Phosphatase: 53
Chloride: 102
GFR calc Af Amer: >60
Potassium: 3.9
Total Bilirubin: 1

## 2011-02-24 LAB — DIFFERENTIAL
Eosinophils Relative: 0
Lymphocytes Relative: 14
Monocytes Absolute: 0.4
Monocytes Relative: 8
Neutro Abs: 4.4

## 2011-02-24 LAB — BASIC METABOLIC PANEL
CO2: 25
Calcium: 9.6
GFR calc Af Amer: >60
GFR calc non Af Amer: >60
Potassium: 4
Sodium: 139

## 2011-02-24 LAB — CBC
HCT: 46.4
Hemoglobin: 15.6
MCHC: 33.6
RBC: 4.99

## 2011-02-24 LAB — APTT: aPTT: 29

## 2011-03-19 ENCOUNTER — Other Ambulatory Visit: Payer: Self-pay | Admitting: *Deleted

## 2011-03-19 MED ORDER — ROSUVASTATIN CALCIUM 20 MG PO TABS: 20.0000 mg | ORAL_TABLET | Freq: Every day | ORAL | Status: DC

## 2011-03-31 ENCOUNTER — Telehealth: Payer: Self-pay | Admitting: Internal Medicine

## 2011-03-31 ENCOUNTER — Telehealth: Payer: Self-pay | Admitting: Cardiology

## 2011-03-31 NOTE — Telephone Encounter (Signed)
Walk In pt Form " Pt Needs Refill" sent to Message Nurse  03/31/11/km

## 2011-03-31 NOTE — Telephone Encounter (Signed)
Pt needs refill of crestor 20 mg 1/2 tab daily , 15 tabs

## 2011-04-02 ENCOUNTER — Other Ambulatory Visit: Payer: Self-pay

## 2011-04-02 MED ORDER — ROSUVASTATIN CALCIUM 20 MG PO TABS: 20.0000 mg | ORAL_TABLET | Freq: Every day | ORAL | Status: DC

## 2011-04-14 ENCOUNTER — Telehealth: Payer: Self-pay | Admitting: Internal Medicine

## 2011-04-14 DIAGNOSIS — E7800 Pure hypercholesterolemia, unspecified: Secondary | ICD-10-CM

## 2011-04-14 MED ORDER — ROSUVASTATIN CALCIUM 20 MG PO TABS: 20.0000 mg | ORAL_TABLET | Freq: Every day | ORAL | Status: DC

## 2011-04-14 NOTE — Telephone Encounter (Signed)
Talked with patients daughter she stated she was trying to help out her father.Stated he needed refill on crestor 20mg s. Advised patient past due for lab work and dr. Visit. Stated she would have her father call back to schedule appointments she did not want to schedule for him.Crestor 20mg s.sent in to [facility].

## 2011-04-14 NOTE — Telephone Encounter (Signed)
Upon review of chart it appears pt has only been seen in lipid clinic and has not seen Dr. Clifton James. He can make appt with any cardiologist for follow up

## 2011-04-14 NOTE — Telephone Encounter (Signed)
Patient needs appointment with Dr. Clifton James and needs labwork appt also.Daughter did not want to schedule she will have her father call back.

## 2011-04-14 NOTE — Telephone Encounter (Signed)
New Problem:  Woulf like to have her father's prescription of Crestor 20mg  refilled with [facility] [phone]. The issue is his cardiologist is currently unknown. He came in on 11/5 and filled out a walk-in patient refill request for Dr. Gala Romney and at 4:17 on the same day we received a call from [facility] asking [place] to refill his prescription with Dr. Antoine Poche.

## 2011-04-15 NOTE — Telephone Encounter (Signed)
Daughter states patient will be calling back for appointment

## 2011-06-20 ENCOUNTER — Encounter: Payer: Self-pay | Admitting: Cardiovascular Disease

## 2011-06-20 ENCOUNTER — Ambulatory Visit (INDEPENDENT_AMBULATORY_CARE_PROVIDER_SITE_OTHER): Payer: Medicare Other | Admitting: Cardiovascular Disease

## 2011-06-20 VITALS — BP 148/82 | Ht 71.0 in | Wt 152.0 lb

## 2011-06-20 DIAGNOSIS — I2510 Atherosclerotic heart disease of native coronary artery without angina pectoris: Secondary | ICD-10-CM

## 2011-06-20 DIAGNOSIS — I10 Essential (primary) hypertension: Secondary | ICD-10-CM

## 2011-06-20 LAB — HEPATIC FUNCTION PANEL
AST: 25 U/L (ref 0–37)
Alkaline Phosphatase: 57 U/L (ref 39–117)
Bilirubin, Direct: 0 mg/dL (ref 0.0–0.3)
Total Bilirubin: 0.9 mg/dL (ref 0.3–1.2)

## 2011-06-20 LAB — LIPID PANEL: Total CHOL/HDL Ratio: 4

## 2011-06-20 MED ORDER — ROSUVASTATIN CALCIUM 20 MG PO TABS: 20.0000 mg | ORAL_TABLET | Freq: Every day | ORAL | Status: DC

## 2011-06-20 MED ORDER — ASPIRIN EC 81 MG PO TBEC: 81.0000 mg | DELAYED_RELEASE_TABLET | Freq: Every day | ORAL | Status: AC

## 2011-06-20 NOTE — Assessment & Plan Note (Signed)
Stable. Continue statin. Restart ASA 81 mg po QDaily. Lipids followed in lipid clinic. He will need f/u arranged in lipid clinic.

## 2011-06-20 NOTE — Progress Notes (Signed)
   History of Present Illness: 78 yo WM with history of HLD, HTN, CAD s/p CABG 1999 here today to re-establish cardiac care. He was last seen in our office in 2007 by Dr. Glennon Hamilton. I am meeting him for the first time today. He has been followed in the lipid clinic by Weston Brass, PharmD and was last seen there in 2011.   He tells me today that he has been feeling well. He has had no chest pain, SOB, palpitations. He describes pain in his shoulders and arms.   His primary care is Dr. Alroy Dust. He has not seen him in the last 4 years.   Past Medical History  Diagnosis Date  . 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 Hypertension   . Coronary artery disease     s/p6 vessel CABG 1999 Dr Sena Hitch  . Hypercholesterolemia   . Osteoarthrosis, unspecified whether generalized or localized, unspecified site   . Low back pain   . Carpal tunnel syndrome     new complaint 05/05/07 ov : rec  - trial of bilat wrist splints.  . Skin cancer     followed by Dr Jorja Loa    Past Surgical History  Procedure Date  . Coronary artery bypass graft     1999 DR Sena Hitch    Current Outpatient Prescriptions  Medication Sig Dispense Refill  . Multiple Vitamin (MULTIVITAMIN) tablet Take 1 tablet by mouth daily.      . rosuvastatin (CRESTOR) 20 MG tablet 20 mg at bedtime. 1 tab every other day        No Known Allergies  History   Social History  . Marital Status: Married    Spouse Name: N/A    Number of Children: N/A  . Years of Education: N/A   Occupational History  . Not on file.   Social History Main Topics  . Smoking status: Not on file  . Smokeless tobacco: Not on file  . Alcohol Use:   . Drug Use:   . Sexually Active:    Other Topics Concern  . Not on file   Social History Narrative  . No narrative on file    No family history on file.  Review of Systems:  As stated in the HPI and otherwise negative.    BP 148/82  Ht 5\' 11"  (1.803 m)  Wt 152 lb (68.947 kg)  BMI 21.20 kg/m2  Physical Examination: General: Well developed, well nourished, NAD HEENT: OP clear, mucus membranes moist SKIN: warm, dry. No rashes. Neuro: No focal deficits Musculoskeletal: Muscle strength 5/5 all ext Psychiatric: Mood and affect normal Neck: No JVD, no carotid bruits, no thyromegaly, no lymphadenopathy. Lungs:Clear bilaterally, no wheezes, rhonci, crackles Cardiovascular: Regular rate and rhythm. No murmurs, gallops or rubs. Abdomen:Soft. Bowel sounds present. Non-tender.  Extremities: No lower extremity edema. Pulses are 2 + in the bilateral DP/PT.  EKG: Sinus rhythm, rate 73 bpm. PACs.

## 2011-06-20 NOTE — Patient Instructions (Signed)
Your physician wants you to follow-up in: 12 months.  You will receive a reminder letter in the mail two months in advance. If you don't receive a letter, please call our office to schedule the follow-up appointment.  Please schedule follow up appt with Lipid clinic   Your physician has recommended you make the following change in your medication: Start aspirin 81 mg by mouth daily.

## 2011-06-20 NOTE — Assessment & Plan Note (Signed)
BP is slightly elevated. He does not wish to add any new meds.

## 2011-06-26 ENCOUNTER — Ambulatory Visit (INDEPENDENT_AMBULATORY_CARE_PROVIDER_SITE_OTHER): Payer: Medicare Other | Admitting: Pharmacist

## 2011-06-26 VITALS — BP 145/80 | Wt 153.0 lb

## 2011-06-26 DIAGNOSIS — E7800 Pure hypercholesterolemia, unspecified: Secondary | ICD-10-CM

## 2011-06-26 NOTE — Progress Notes (Signed)
HPI:  [name] is an 7 yoWM presenting for dyslipidemia follow up.  Patient is treated with Crestor 20 mg every other day prior to bedtime for dyslipidemia.  Today, he complains of joint pain in arms, shoulders, hands and legs.  He describes it as recent (last 2-3 months), transient, sharp, worse when walking, wakes up at night with the pain, and is similar to what he experienced on a higher dose of Crestor.  Discussed with Dr. Clifton James at previous appointment.  Patient believes it to be arthritis potentially.  Patient is able to walk daily ~1 mile 2-3 times daily (takes 15 minutes).  Diet consists of mostly baked chicken, with limited red meat, chicken casserole, lima beans, peas, potato salad, rice, chicken sandwich.  For breakfast, patient eats cereal (corn flakes, raisin bran) or oatmeal.  He drinks a 16 oz diet coke once daily and water regularly.  Patient does not smoke tobacco.  Current Outpatient Prescriptions  Medication Sig Dispense Refill  . aspirin EC 81 MG tablet Take 1 tablet (81 mg total) by mouth daily.  150 tablet  2  . Multiple Vitamin (MULTIVITAMIN) tablet Take 1 tablet by mouth daily.      . rosuvastatin (CRESTOR) 20 MG tablet Take 1 tablet (20 mg total) by mouth at bedtime. 1 tab every other day  15 tablet  0   No Known Allergies

## 2011-06-26 NOTE — Assessment & Plan Note (Addendum)
Current lipid panel:  TC 186 (goal <200), TG 164 (goal<150), HDL 45 (goal >40), LDL 108 (goal <100).  Diet is fairly well controlled and consists of mainly chicken casserole (provided by his daughter) or chicken/rice with vegetables. Cholesterol has been stable on this Crestor dose. Patient states some pain in his joints (knees, hands and shoulders). Do not believe this to be related to Crestor at this time but will continue to monitor. Told patient to call if this worsens. Since patient is stable there is no need to continue coming to the Lipid Clinic  Plan: 1)  Continue taking Crestor 20 mg every other day.   2)  Stay active and continue walking daily. 3)  Continue eating chicken and vegetables, with limited red meat and fried foods. 4)  Lipid Clinic will sign off and patient can follow up yearly with Dr. Clifton James in the office.  Thank you

## 2011-06-26 NOTE — Patient Instructions (Addendum)
Continue taking Crestor 20 mg every other day.  Call if you continue to have muscle/joint pains that bother you.  Stay active and continue walking daily.  Continue eating chicken and vegetables, with limited red meat and fried foods.  Dr. Clifton James will continue to follow your cholesterol levels at regular yearly appointments.

## 2011-08-11 ENCOUNTER — Other Ambulatory Visit: Payer: Self-pay

## 2011-08-11 DIAGNOSIS — I2510 Atherosclerotic heart disease of native coronary artery without angina pectoris: Secondary | ICD-10-CM

## 2011-08-11 MED ORDER — ROSUVASTATIN CALCIUM 20 MG PO TABS: 20.0000 mg | ORAL_TABLET | Freq: Every day | ORAL | Status: DC

## 2012-05-07 ENCOUNTER — Emergency Department (HOSPITAL_COMMUNITY)
Admission: EM | Admit: 2012-05-07 | Discharge: 2012-05-07 | Disposition: A | Discharge status: Home / Self Care | Payer: Medicare Other | Source: Home / Self Care | Attending: Family Medicine | Admitting: Family Medicine

## 2012-05-07 ENCOUNTER — Encounter (HOSPITAL_COMMUNITY): Payer: Self-pay | Admitting: Emergency Medicine

## 2012-05-07 ENCOUNTER — Telehealth: Payer: Self-pay | Admitting: Cardiovascular Disease

## 2012-05-07 ENCOUNTER — Telehealth: Payer: Self-pay | Admitting: Pulmonary Disease

## 2012-05-07 DIAGNOSIS — K6289 Other specified diseases of anus and rectum: Secondary | ICD-10-CM

## 2012-05-07 DIAGNOSIS — S3669XA Other injury of rectum, initial encounter: Secondary | ICD-10-CM

## 2012-05-07 DIAGNOSIS — K5900 Constipation, unspecified: Secondary | ICD-10-CM

## 2012-05-07 NOTE — Telephone Encounter (Signed)
Per SN---last seen 2008.   Sorry we are not able to accept new pts at this time but would be happy to set pt up with a new primary care doctor.

## 2012-05-07 NOTE — Telephone Encounter (Signed)
lmomtcb x1 for pt 

## 2012-05-07 NOTE — Telephone Encounter (Signed)
Pt complaining of constipation and "balloon like problem occurred" when straining to have a bm.  He also had a lot of clear "fluid" coming from rectum.  Pt does not have a pcp and wants to see GI.  I recommended that he go to urgent care or the er.

## 2012-05-07 NOTE — ED Provider Notes (Signed)
History     CSN: [id number]  Arrival date & time 05/07/12  1103   First MD Initiated Contact with Patient 05/07/12 1210      Chief Complaint  Patient presents with  . gi concern     (Consider location/radiation/quality/duration/timing/severity/associated sxs/prior treatment) Patient is a 78 y.o. male presenting with GI illness. The history is provided by the patient and a relative.  GI Problem  This is a new problem. The current episode started yesterday. The problem has been gradually improving. There has been no fever. Associated symptoms comments: Constipation with rectal swelling last eve after bm..    Past Medical History  Diagnosis Date  . 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 Hypertension   . Coronary artery disease     s/p6 vessel CABG 1999 Dr Sena Hitch  . Hypercholesterolemia   . Osteoarthrosis, unspecified whether generalized or localized, unspecified site   . Low back pain   . Carpal tunnel syndrome     new complaint 05/05/07 ov : rec  - trial of bilat wrist splints.  . Skin cancer     followed by Dr Jorja Loa    Past Surgical History  Procedure Date  . Coronary artery bypass graft     1999 DR Sena Hitch    Family History  Problem Relation Age of Onset  . Heart failure Mother   . Heart failure Father     History  Substance Use Topics  . Smoking status: Never Smoker   . Smokeless tobacco: Not on file     Comment: pt stopped at age 62  . Alcohol Use: No      Review of Systems  Constitutional: Negative.   Gastrointestinal: Positive for diarrhea, constipation and anal bleeding. Negative for blood in stool.    Allergies  Review of patient's allergies indicates no known allergies.  Home Medications   Current Outpatient Rx  Name  Route  Sig  Dispense  Refill  . ASPIRIN EC 81 MG PO TBEC   Oral   Take 1 tablet (81 mg total) by mouth daily.   150 tablet   2   .  ONE-DAILY MULTI VITAMINS PO TABS   Oral   Take 1 tablet by mouth daily.         Marland Kitchen ROSUVASTATIN CALCIUM 20 MG PO TABS   Oral   Take 1 tablet (20 mg total) by mouth at bedtime. 1 tab every other day   15 tablet   9     BP 145/85  Pulse 75  Temp 97.7 F (36.5 C) (Oral)  Resp 18  SpO2 94%  Physical Exam  Nursing note and vitals reviewed. Constitutional: He is oriented to person, place, and time. He appears well-developed and well-nourished.  Abdominal: Soft. Bowel sounds are normal. He exhibits no distension and no mass. There is no tenderness. There is no rebound and no guarding.  Genitourinary:       Impacted ano rectal stool with blood inferiorly from tear , no hemorrhoids, stool is brown.  Neurological: He is alert and oriented to person, place, and time.    ED Course  Procedures (including critical care time)  Labs Reviewed - No data to display No results found.   1. Constipation   2. Rectal tear       MDM          Linna Hoff, MD 05/07/12  1232 

## 2012-05-07 NOTE — ED Notes (Signed)
Reports GI concerns.  Patient states sx started yesterday.  C/o watery diarrhea.  No pain in abdominal area.  No blood in stool.  Biggest concern he had a balloon effect and then liquids started coming through his bowels.  Patient states he does have a little pain in bowels.

## 2012-05-07 NOTE — Telephone Encounter (Signed)
Pt was last seen in 2008 by SN---SN would you like to see this pt again or should we refer to primary care.  Please advise. Thanks  No Known Allergies

## 2012-05-07 NOTE — Telephone Encounter (Signed)
New Problem:    Patient's daughter called in because the patient is having bowel issues and needs a referral to see a physician at [facility].  Tried calling in to set up a new patient appointment but was told that her father would need a referral form another office before he could be seen.  Please call back.

## 2012-05-07 NOTE — Telephone Encounter (Signed)
(  continue) Pt's daughter states that pt was seen by EMTs last night & was told to f/u w/ pt's PCP.  Pt's daughter requested to be put through to Primary Care this AM.  Unsure if they are setting up an appt w/ them or not.  Antionette Fairy

## 2012-05-10 NOTE — Telephone Encounter (Signed)
I spoke with Pt's daughter, [name], and she has been notified that SN will help set the pt up with a new PCP if this is what they decide to do. She did not want to do this at this time but says she will call if she changes her mind. [name] stated that the immediate problem has been taken care of and the pt was seen at [facility]. Pt will also be seeing Dr. Clifton James, cardiology.  Nothing further is needed at this time.

## 2012-07-07 ENCOUNTER — Other Ambulatory Visit: Payer: Self-pay

## 2012-07-07 DIAGNOSIS — I2510 Atherosclerotic heart disease of native coronary artery without angina pectoris: Secondary | ICD-10-CM

## 2012-07-27 ENCOUNTER — Other Ambulatory Visit: Payer: Self-pay | Admitting: *Deleted

## 2012-07-27 MED ORDER — ROSUVASTATIN CALCIUM 20 MG PO TABS: 20.0000 mg | ORAL_TABLET | Freq: Every day | ORAL | Status: DC

## 2013-01-03 ENCOUNTER — Other Ambulatory Visit: Payer: Self-pay

## 2013-01-03 DIAGNOSIS — I2510 Atherosclerotic heart disease of native coronary artery without angina pectoris: Secondary | ICD-10-CM

## 2013-01-03 MED ORDER — ROSUVASTATIN CALCIUM 20 MG PO TABS: 20.0000 mg | ORAL_TABLET | Freq: Every day | ORAL | Status: DC

## 2013-07-07 ENCOUNTER — Ambulatory Visit (INDEPENDENT_AMBULATORY_CARE_PROVIDER_SITE_OTHER): Payer: Medicare Other | Admitting: Cardiovascular Disease

## 2013-07-07 ENCOUNTER — Encounter: Payer: Self-pay | Admitting: Cardiovascular Disease

## 2013-07-07 VITALS — BP 134/86 | HR 67 | Ht 67.0 in | Wt 163.0 lb

## 2013-07-07 DIAGNOSIS — E7800 Pure hypercholesterolemia, unspecified: Secondary | ICD-10-CM

## 2013-07-07 DIAGNOSIS — I2510 Atherosclerotic heart disease of native coronary artery without angina pectoris: Secondary | ICD-10-CM

## 2013-07-07 DIAGNOSIS — I10 Essential (primary) hypertension: Secondary | ICD-10-CM

## 2013-07-07 LAB — LIPID PANEL
Cholesterol: 175 mg/dL (ref 0–200)
HDL: 36.4 mg/dL — ABNORMAL LOW
Total CHOL/HDL Ratio: 5
Triglycerides: 305 mg/dL — ABNORMAL HIGH (ref 0.0–149.0)
VLDL: 61 mg/dL — ABNORMAL HIGH (ref 0.0–40.0)

## 2013-07-07 LAB — HEPATIC FUNCTION PANEL
ALK PHOS: 50 U/L (ref 39–117)
ALT: 18 U/L (ref 0–53)
AST: 24 U/L (ref 0–37)
Albumin: 4.1 g/dL (ref 3.5–5.2)
BILIRUBIN DIRECT: 0 mg/dL (ref 0.0–0.3)
BILIRUBIN TOTAL: 0.8 mg/dL (ref 0.3–1.2)
TOTAL PROTEIN: 7.1 g/dL (ref 6.0–8.3)

## 2013-07-07 LAB — LDL CHOLESTEROL, DIRECT: Direct LDL: 90.5 mg/dL

## 2013-07-07 MED ORDER — ROSUVASTATIN CALCIUM 20 MG PO TABS: ORAL_TABLET | ORAL | Status: DC

## 2013-07-07 NOTE — Progress Notes (Signed)
History of Present Illness: 80 yo WM with history of HLD, HTN, CAD s/p CABG 1999 here today for cardiac follow up.   He tells me today that he has been feeling well. He has had no chest pain, SOB, palpitations. He is very active. He walks daily.   Primary Care Physician: None  Last Lipid Profile:Lipid Panel     Component Value Date/Time   CHOL 186 06/20/2011 0929   TRIG 164.0* 06/20/2011 0929   HDL 45.30 06/20/2011 0929   CHOLHDL 4 06/20/2011 0929   VLDL 32.8 06/20/2011 0929   LDLCALC 108* 06/20/2011 0929     Past Medical History  Diagnosis Date  . 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 Hypertension   . Coronary artery disease     s/p6 vessel CABG 1999 Dr Jobie Quaker  . Hypercholesterolemia   . Osteoarthrosis, unspecified whether generalized or localized, unspecified site   . Low back pain   . Carpal tunnel syndrome     new complaint 05/05/07 ov : rec  - trial of bilat wrist splints.  . Skin cancer     followed by Dr Denna Haggard    Past Surgical History  Procedure Laterality Date  . Coronary artery bypass graft      1999 DR Jobie Quaker    Current Outpatient Prescriptions  Medication Sig Dispense Refill  . Multiple Vitamin (MULTIVITAMIN) tablet Take 1 tablet by mouth daily.      . rosuvastatin (CRESTOR) 20 MG tablet 1 tab every other day       No current facility-administered medications for this visit.    No Known Allergies  History   Social History  . Marital Status: Married    Spouse Name: N/A    Number of Children: 2  . Years of Education: N/A   Occupational History  . retired    Social History Main Topics  . Smoking status: Never Smoker   . Smokeless tobacco: Not on file     Comment: pt stopped at age 65  . Alcohol Use: No  . Drug Use: No  . Sexual Activity: Not on file   Other Topics Concern  . Not on file   Social History Narrative  . No narrative on file    Family  History  Problem Relation Age of Onset  . Heart failure Mother   . Heart failure Father     Review of Systems:  As stated in the HPI and otherwise negative.   BP 134/86  Pulse 67  Ht 5\' 7"  (1.702 m)  Wt 163 lb (73.936 kg)  BMI 25.52 kg/m2  Physical Examination: General: Well developed, well nourished, NAD HEENT: OP clear, mucus membranes moist SKIN: warm, dry. No rashes. Neuro: No focal deficits Musculoskeletal: Muscle strength 5/5 all ext  Psychiatric: Mood and affect normal Neck: No JVD, no carotid bruits, no thyromegaly, no lymphadenopathy. Lungs:Clear bilaterally, no wheezes, rhonci, crackles Cardiovascular: Regular rate and rhythm. No murmurs, gallops or rubs. Abdomen:Soft. Bowel sounds present. Non-tender.  Extremities: No lower extremity edema. Pulses are 2 + in the bilateral DP/PT.  EKG: NSR, rate 67 bpm. LAD  Assessment and Plan:   1. CAD: s/p CABG. Stable. Continue ASA and statin. I will not plan repeat echo or stress testing given his age and the fact that he is doing so well. He will call if he develops chest pressure or SOB.  2. HTN: BP is in a normal range. He is on no therapy at this time. Will continue to follow.   3. Hyperlipidemia: Lipids well controlled on statin in past. He takes Crestor every other day. Will continue. Repeat lipids and LFTs now.

## 2013-07-07 NOTE — Patient Instructions (Signed)
Your physician wants you to follow-up in:  12 months.  You will receive a reminder letter in the mail two months in advance. If you don't receive a letter, please call our office to schedule the follow-up appointment.   Your physician has recommended you make the following change in your medication: Resume Crestor 20 mg by mouth every other day

## 2014-08-03 DIAGNOSIS — L03115 Cellulitis of right lower limb: Secondary | ICD-10-CM | POA: Diagnosis not present

## 2014-08-09 DIAGNOSIS — C44722 Squamous cell carcinoma of skin of right lower limb, including hip: Secondary | ICD-10-CM | POA: Diagnosis not present

## 2014-08-09 DIAGNOSIS — C44721 Squamous cell carcinoma of skin of unspecified lower limb, including hip: Secondary | ICD-10-CM | POA: Diagnosis not present

## 2014-08-09 DIAGNOSIS — D239 Other benign neoplasm of skin, unspecified: Secondary | ICD-10-CM | POA: Diagnosis not present

## 2014-08-09 DIAGNOSIS — L821 Other seborrheic keratosis: Secondary | ICD-10-CM | POA: Diagnosis not present

## 2014-09-12 DIAGNOSIS — C44722 Squamous cell carcinoma of skin of right lower limb, including hip: Secondary | ICD-10-CM | POA: Diagnosis not present

## 2014-09-19 DIAGNOSIS — S81801A Unspecified open wound, right lower leg, initial encounter: Secondary | ICD-10-CM | POA: Diagnosis not present

## 2014-09-26 DIAGNOSIS — S81801D Unspecified open wound, right lower leg, subsequent encounter: Secondary | ICD-10-CM | POA: Diagnosis not present

## 2014-10-03 ENCOUNTER — Encounter: Payer: Self-pay | Admitting: Family Medicine

## 2014-10-03 ENCOUNTER — Ambulatory Visit (INDEPENDENT_AMBULATORY_CARE_PROVIDER_SITE_OTHER): Payer: Medicare Other | Admitting: Family Medicine

## 2014-10-03 VITALS — BP 126/78 | HR 76 | Temp 97.5°F | Ht 67.0 in | Wt 159.5 lb

## 2014-10-03 DIAGNOSIS — E7800 Pure hypercholesterolemia, unspecified: Secondary | ICD-10-CM

## 2014-10-03 DIAGNOSIS — M19042 Primary osteoarthritis, left hand: Secondary | ICD-10-CM | POA: Insufficient documentation

## 2014-10-03 DIAGNOSIS — L02511 Cutaneous abscess of right hand: Secondary | ICD-10-CM

## 2014-10-03 DIAGNOSIS — M19041 Primary osteoarthritis, right hand: Secondary | ICD-10-CM

## 2014-10-03 DIAGNOSIS — I25810 Atherosclerosis of coronary artery bypass graft(s) without angina pectoris: Secondary | ICD-10-CM

## 2014-10-03 DIAGNOSIS — IMO0002 Reserved for concepts with insufficient information to code with codable children: Secondary | ICD-10-CM | POA: Insufficient documentation

## 2014-10-03 DIAGNOSIS — I10 Essential (primary) hypertension: Secondary | ICD-10-CM | POA: Diagnosis not present

## 2014-10-03 DIAGNOSIS — C44702 Unspecified malignant neoplasm of skin of right lower limb, including hip: Secondary | ICD-10-CM

## 2014-10-03 MED ORDER — CEPHALEXIN 500 MG PO CAPS: 500.0000 mg | ORAL_CAPSULE | Freq: Two times a day (BID) | ORAL | Status: DC

## 2014-10-03 NOTE — Assessment & Plan Note (Signed)
R index finger - present for last month, resolving on its own. Will add keflex 500mg  bid course x 1 wk. Update if not improving with treatment.

## 2014-10-03 NOTE — Assessment & Plan Note (Signed)
Anticipate osteoarthritis. Discussed with patient. Recommended tylenol 500mg  TID PRN pain.

## 2014-10-03 NOTE — Assessment & Plan Note (Addendum)
Off statin. States crestor made him feel bad. Check FLP next labwork and discuss less potent statin.

## 2014-10-03 NOTE — Assessment & Plan Note (Signed)
Stable off meds. ?

## 2014-10-03 NOTE — Assessment & Plan Note (Signed)
Continue f/u with derm

## 2014-10-03 NOTE — Assessment & Plan Note (Signed)
Stable on aspirin daily. Not currently on statin.

## 2014-10-03 NOTE — Patient Instructions (Addendum)
For finger - treat with antibiotic keflex course.  For hand joint pains - start taking tylenol 500mg  twice daily as needed Good to meet you today, call us with questions. Return in 4-6 months for medicare wellness visit.

## 2014-10-03 NOTE — Progress Notes (Signed)
BP 126/78 mmHg  Pulse 76  Temp(Src) 97.5 F (36.4 C) (Oral)  Ht 5\' 7"  (1.702 m)  Wt 159 lb 8 oz (72.349 kg)  BMI 24.98 kg/m2   CC: new pt to establish  Subjective:    Patient ID: [name], male    DOB: Sep 09, 1925, 81 y.o.   MRN: [id number]  HPI: [name] is a 81 y.o. male presenting on 10/03/2014 for Establish Care   Prior saw Dr Lenna Gilford, but recently has been seeing cardiologists.  Presents with daughter today Olin Hauser.   Main concern today is bilateral finger pains with swelling, stiffness. Predominantly IPs, not so much MCPs. No h/o psoriasis. + other large joint pains (shoulders, hips, and knees.  Recent R lower leg skin cancer s/p excision. Dermatologist Lavonna Monarch) and skin surgeon Link Snuffer). Sees weekly.   Tip of R index finger with infection x 1 month so he wrapped finger with tape. Self treated with peroxide and neosporin as well. Did not seek care until today.  Off all meds except aspiring and multivitamin.  Relevant past medical, surgical, family and social history reviewed and updated as indicated. Interim medical history since our last visit reviewed. Allergies and medications reviewed and updated. Current Outpatient Prescriptions on File Prior to Visit  Medication Sig  . aspirin EC 81 MG tablet Take 81 mg by mouth daily.  . Multiple Vitamin (MULTIVITAMIN) tablet Take 1 tablet by mouth daily.   No current facility-administered medications on file prior to visit.    Review of Systems Per HPI unless specifically indicated above     Objective:    BP 126/78 mmHg  Pulse 76  Temp(Src) 97.5 F (36.4 C) (Oral)  Ht 5\' 7"  (1.702 m)  Wt 159 lb 8 oz (72.349 kg)  BMI 24.98 kg/m2  Wt Readings from Last 3 Encounters:  10/03/14 159 lb 8 oz (72.349 kg)  07/07/13 163 lb (73.936 kg)  06/26/11 153 lb (69.4 kg)    Physical Exam  Constitutional: He is oriented to person, place, and time. He appears well-developed and well-nourished. No distress.  HENT:    Head: Normocephalic and atraumatic.  Right Ear: Hearing, tympanic membrane, external ear and ear canal normal.  Left Ear: Hearing, tympanic membrane, external ear and ear canal normal.  Nose: Nose normal.  Mouth/Throat: Uvula is midline, oropharynx is clear and moist and mucous membranes are normal. No oropharyngeal exudate, posterior oropharyngeal edema or posterior oropharyngeal erythema.  Eyes: Conjunctivae and EOM are normal. Pupils are equal, round, and reactive to light. No scleral icterus.  Neck: Normal range of motion. Neck supple.  Cardiovascular: Normal rate, regular rhythm, normal heart sounds and intact distal pulses.   No murmur heard. Pulses:      Radial pulses are 2+ on the right side, and 2+ on the left side.  Pulmonary/Chest: Effort normal and breath sounds normal. No respiratory distress. He has no wheezes. He has no rales.  Musculoskeletal: Normal range of motion. He exhibits no edema.  IP swelling and deformity throughout bilateral hands Old wound at pulp distal R index finger without surrounding erythema or induration or pain  Lymphadenopathy:    He has no cervical adenopathy.  Neurological: He is alert and oriented to person, place, and time.  CN grossly intact, station and gait intact  Skin: Skin is warm and dry. No rash noted.  Psychiatric: He has a normal mood and affect. His behavior is normal. Judgment and thought content normal.  Nursing note and vitals reviewed.  Results for orders placed or performed in visit on 07/07/13  Lipid Profile  Result Value Ref Range   Cholesterol 175 0 - 200 mg/dL   Triglycerides 305.0 (H) 0.0 - 149.0 mg/dL   HDL 36.40 (L) >39.00 mg/dL   VLDL 61.0 (H) 0.0 - 40.0 mg/dL   Total CHOL/HDL Ratio 5   Hepatic function panel  Result Value Ref Range   Total Bilirubin 0.8 0.3 - 1.2 mg/dL   Bilirubin, Direct 0.0 0.0 - 0.3 mg/dL   Alkaline Phosphatase 50 39 - 117 U/L   AST 24 0 - 37 U/L   ALT 18 0 - 53 U/L   Total Protein 7.1 6.0 -  8.3 g/dL   Albumin 4.1 3.5 - 5.2 g/dL  LDL cholesterol, direct  Result Value Ref Range   Direct LDL 90.5 mg/dL   Lab Results  Component Value Date   CREATININE 0.99 02/08/2008       Assessment & Plan:   Problem List Items Addressed This Visit    Pure hypercholesterolemia    Off statin. States crestor made him feel bad. Check FLP next labwork and discuss less potent statin.      Primary osteoarthritis of both hands - Primary    Anticipate osteoarthritis. Discussed with patient. Recommended tylenol 500mg  TID PRN pain.       Relevant Medications   acetaminophen (TYLENOL) 500 MG tablet   Felon    R index finger - present for last month, resolving on its own. Will add keflex 500mg  bid course x 1 wk. Update if not improving with treatment.      Relevant Medications   cephALEXin (KEFLEX) 500 MG capsule   Essential hypertension    Stable off meds.       Cancer of skin of right lower leg    Continue f/u with derm.      Relevant Medications   acetaminophen (TYLENOL) 500 MG tablet   CAD (coronary artery disease) of artery bypass graft    Stable on aspirin daily. Not currently on statin.           Follow up plan: Return in about 4 months (around 02/03/2015), or as needed, for medicare wellness.

## 2014-10-03 NOTE — Progress Notes (Signed)
Pre visit review using our clinic review tool, if applicable. No additional management support is needed unless otherwise documented below in the visit note. 

## 2014-10-05 DIAGNOSIS — S81801D Unspecified open wound, right lower leg, subsequent encounter: Secondary | ICD-10-CM | POA: Diagnosis not present

## 2014-10-12 DIAGNOSIS — S81801D Unspecified open wound, right lower leg, subsequent encounter: Secondary | ICD-10-CM | POA: Diagnosis not present

## 2014-10-19 DIAGNOSIS — S81801D Unspecified open wound, right lower leg, subsequent encounter: Secondary | ICD-10-CM | POA: Diagnosis not present

## 2014-11-14 DIAGNOSIS — L929 Granulomatous disorder of the skin and subcutaneous tissue, unspecified: Secondary | ICD-10-CM | POA: Diagnosis not present

## 2016-02-12 ENCOUNTER — Encounter: Payer: Self-pay | Admitting: Family Medicine

## 2016-02-12 ENCOUNTER — Ambulatory Visit (INDEPENDENT_AMBULATORY_CARE_PROVIDER_SITE_OTHER): Payer: Medicare Other | Admitting: Family Medicine

## 2016-02-12 VITALS — BP 120/84 | HR 66 | Temp 98.0°F | Wt 165.0 lb

## 2016-02-12 DIAGNOSIS — M2550 Pain in unspecified joint: Secondary | ICD-10-CM

## 2016-02-12 DIAGNOSIS — E7800 Pure hypercholesterolemia, unspecified: Secondary | ICD-10-CM | POA: Diagnosis not present

## 2016-02-12 DIAGNOSIS — I10 Essential (primary) hypertension: Secondary | ICD-10-CM

## 2016-02-12 MED ORDER — TRAMADOL HCL 50 MG PO TABS: 25.0000 mg | ORAL_TABLET | Freq: Two times a day (BID) | ORAL | 0 refills | Status: DC | PRN

## 2016-02-12 NOTE — Progress Notes (Signed)
Pre visit review using our clinic review tool, if applicable. No additional management support is needed unless otherwise documented below in the visit note. 

## 2016-02-12 NOTE — Progress Notes (Signed)
BP 120/84 (BP Location: Left Arm, Patient Position: Sitting, Cuff Size: Normal)   Pulse 66   Temp 98 F (36.7 C) (Oral)   Wt 165 lb (74.8 kg)   BMI 25.84 kg/m    CC: pain Subjective:    Patient ID: [name], male    DOB: [birth year], 82 y.o.   MRN: [id number]  HPI: [name] is a 82 y.o. male presenting on 02/12/2016 for Pain (Legs, arms, shoulders. says it is mostly all of his joints.)   Here with daughter [name]. Established last year, not seen since.  Ongoing arthralgias - neck, shoulders, arms, legs. Denies significant redness/swelling or warmth. Denies fevers/chills, rashes, nausea. More noticeable with exertion. Daughter massages joints which helps temporarily. Tylenol helps some but upsets stomach. Glucosamine also caused stomach upset.   + endorses family history psoriasis as well as fmhx psoriasis.   Relevant past medical, surgical, family and social history reviewed and updated as indicated. Interim medical history since our last visit reviewed. Allergies and medications reviewed and updated. Current Outpatient Prescriptions on File Prior to Visit  Medication Sig  . acetaminophen (TYLENOL) 500 MG tablet Take 500 mg by mouth 3 (three) times daily as needed.  Marland Kitchen aspirin EC 81 MG tablet Take 81 mg by mouth daily.  . Multiple Vitamin (MULTIVITAMIN) tablet Take 1 tablet by mouth daily.   No current facility-administered medications on file prior to visit.     Review of Systems Per HPI unless specifically indicated in ROS section     Objective:    BP 120/84 (BP Location: Left Arm, Patient Position: Sitting, Cuff Size: Normal)   Pulse 66   Temp 98 F (36.7 C) (Oral)   Wt 165 lb (74.8 kg)   BMI 25.84 kg/m   Wt Readings from Last 3 Encounters:  02/12/16 165 lb (74.8 kg)  10/03/14 159 lb 8 oz (72.3 kg)  07/07/13 163 lb (73.9 kg)    Physical Exam  Constitutional: He appears well-developed and well-nourished. No distress.  HENT:  HOH  Musculoskeletal: He  exhibits no edema.  Deformity of DIPs of bilateral hands without active synovitis. No significant joint pains to palpation today Diminished DP bilaterally Poor foot hygiene - wearing 2 socks, inner sock with caked dirt, dirt between toes, scaling of dorsal and plantar feet, thickened onychomycotic nails  Skin: Skin is warm and dry.  Nursing note and vitals reviewed.  Results for orders placed or performed in visit on 07/07/13  Lipid Profile  Result Value Ref Range   Cholesterol 175 0 - 200 mg/dL   Triglycerides 305.0 (H) 0.0 - 149.0 mg/dL   HDL 36.40 (L) >39.00 mg/dL   VLDL 61.0 (H) 0.0 - 40.0 mg/dL   Total CHOL/HDL Ratio 5   Hepatic function panel  Result Value Ref Range   Total Bilirubin 0.8 0.3 - 1.2 mg/dL   Bilirubin, Direct 0.0 0.0 - 0.3 mg/dL   Alkaline Phosphatase 50 39 - 117 U/L   AST 24 0 - 37 U/L   ALT 18 0 - 53 U/L   Total Protein 7.1 6.0 - 8.3 g/dL   Albumin 4.1 3.5 - 5.2 g/dL  LDL cholesterol, direct  Result Value Ref Range   Direct LDL 90.5 mg/dL      Assessment & Plan:   Problem List Items Addressed This Visit    Essential hypertension    Stable off meds      Polyarthralgia - Primary    Anticipate osteoarthritis although daughter endorses  h/o psoriasis. Check labs when returns. Discussed tylenol, NSAID use. Will prescribe tramadol 1/2 tab prn stronger pain. Discussed caution with sedation, constipation, AMS on tramadol.       Relevant Orders   Uric acid   Sedimentation rate   CBC with Differential/Platelet   Pure hypercholesterolemia    Update FLP when returns fasting. Pt does not want statin as crestor made him feel bad.       Relevant Orders   Lipid panel   Comprehensive metabolic panel    Other Visit Diagnoses   None.      Follow up plan: Return if symptoms worsen or fail to improve.  Ria Bush, MD

## 2016-02-12 NOTE — Assessment & Plan Note (Signed)
Stable off meds. ?

## 2016-02-12 NOTE — Assessment & Plan Note (Addendum)
Update FLP when returns fasting. Pt does not want statin as crestor made him feel bad.

## 2016-02-12 NOTE — Assessment & Plan Note (Signed)
Anticipate osteoarthritis although daughter endorses h/o psoriasis. Check labs when returns. Discussed tylenol, NSAID use. Will prescribe tramadol 1/2 tab prn stronger pain. Discussed caution with sedation, constipation, AMS on tramadol.

## 2016-02-12 NOTE — Patient Instructions (Addendum)
I recommend cutting nails down.  I recommend 2000 units vit D if able.  I think joint pains are coming from wear and tear arthritis - osteoarthritis. You may have some plaque buildup in the leg arteries. Continue aspirin daily. Don't overdo it.  Return fasting for labwork one morning this week. Try tramadol for pain - start at 1/2 tablet as needed.

## 2016-02-14 ENCOUNTER — Other Ambulatory Visit (INDEPENDENT_AMBULATORY_CARE_PROVIDER_SITE_OTHER): Payer: Medicare Other

## 2016-02-14 DIAGNOSIS — R7989 Other specified abnormal findings of blood chemistry: Secondary | ICD-10-CM | POA: Diagnosis not present

## 2016-02-14 DIAGNOSIS — E7800 Pure hypercholesterolemia, unspecified: Secondary | ICD-10-CM | POA: Diagnosis not present

## 2016-02-14 DIAGNOSIS — M2550 Pain in unspecified joint: Secondary | ICD-10-CM | POA: Diagnosis not present

## 2016-02-14 LAB — CBC WITH DIFFERENTIAL/PLATELET
BASOS ABS: 0.1 10*3/uL (ref 0.0–0.1)
Basophils Relative: 0.9 % (ref 0.0–3.0)
EOS ABS: 0.2 10*3/uL (ref 0.0–0.7)
Eosinophils Relative: 3.1 % (ref 0.0–5.0)
HCT: 44.3 % (ref 39.0–52.0)
Hemoglobin: 15.2 g/dL (ref 13.0–17.0)
LYMPHS ABS: 2.7 10*3/uL (ref 0.7–4.0)
LYMPHS PCT: 42.4 % (ref 12.0–46.0)
MCHC: 34.3 g/dL (ref 30.0–36.0)
MCV: 89.9 fl (ref 78.0–100.0)
Monocytes Absolute: 0.4 10*3/uL (ref 0.1–1.0)
Monocytes Relative: 6.9 % (ref 3.0–12.0)
NEUTROS ABS: 3 10*3/uL (ref 1.4–7.7)
NEUTROS PCT: 46.7 % (ref 43.0–77.0)
PLATELETS: 230 10*3/uL (ref 150.0–400.0)
RBC: 4.93 Mil/uL (ref 4.22–5.81)
RDW: 15.3 % (ref 11.5–15.5)
WBC: 6.5 10*3/uL (ref 4.0–10.5)

## 2016-02-14 LAB — COMPREHENSIVE METABOLIC PANEL
ALBUMIN: 4 g/dL (ref 3.5–5.2)
ALK PHOS: 55 U/L (ref 39–117)
ALT: 22 U/L (ref 0–53)
AST: 27 U/L (ref 0–37)
BUN: 18 mg/dL (ref 6–23)
CO2: 29 mEq/L (ref 19–32)
CREATININE: 1.25 mg/dL (ref 0.40–1.50)
Calcium: 9.6 mg/dL (ref 8.4–10.5)
Chloride: 101 mEq/L (ref 96–112)
GFR: 57.86 mL/min — ABNORMAL LOW (ref >60.00)
Glucose, Bld: 90 mg/dL (ref 70–99)
POTASSIUM: 4.6 meq/L (ref 3.5–5.1)
SODIUM: 138 meq/L (ref 135–145)
TOTAL PROTEIN: 7.4 g/dL (ref 6.0–8.3)
Total Bilirubin: 0.8 mg/dL (ref 0.2–1.2)

## 2016-02-14 LAB — SEDIMENTATION RATE: Sed Rate: 13 mm/hr (ref 0–20)

## 2016-02-14 LAB — LIPID PANEL
CHOLESTEROL: 304 mg/dL — AB (ref 0–200)
HDL: 33.4 mg/dL — ABNORMAL LOW (ref >39.00)
Total CHOL/HDL Ratio: 9
Triglycerides: 489 mg/dL — ABNORMAL HIGH (ref 0.0–149.0)

## 2016-02-14 LAB — URIC ACID: URIC ACID, SERUM: 6.7 mg/dL (ref 4.0–7.8)

## 2016-02-14 LAB — LDL CHOLESTEROL, DIRECT: LDL DIRECT: 124 mg/dL

## 2016-02-16 ENCOUNTER — Emergency Department (HOSPITAL_COMMUNITY): Payer: Medicare Other

## 2016-02-16 ENCOUNTER — Emergency Department (HOSPITAL_COMMUNITY)
Admission: EM | Admit: 2016-02-16 | Discharge: 2016-02-16 | Disposition: A | Discharge status: Home / Self Care | Payer: Medicare Other | Attending: Emergency Medicine | Admitting: Emergency Medicine

## 2016-02-16 ENCOUNTER — Encounter (HOSPITAL_COMMUNITY): Payer: Self-pay | Admitting: Emergency Medicine

## 2016-02-16 DIAGNOSIS — R410 Disorientation, unspecified: Secondary | ICD-10-CM | POA: Diagnosis not present

## 2016-02-16 DIAGNOSIS — Z7982 Long term (current) use of aspirin: Secondary | ICD-10-CM | POA: Diagnosis not present

## 2016-02-16 DIAGNOSIS — R54 Age-related physical debility: Secondary | ICD-10-CM | POA: Diagnosis not present

## 2016-02-16 DIAGNOSIS — I2510 Atherosclerotic heart disease of native coronary artery without angina pectoris: Secondary | ICD-10-CM | POA: Diagnosis not present

## 2016-02-16 DIAGNOSIS — Y829 Unspecified medical devices associated with adverse incidents: Secondary | ICD-10-CM | POA: Diagnosis not present

## 2016-02-16 DIAGNOSIS — Z951 Presence of aortocoronary bypass graft: Secondary | ICD-10-CM | POA: Insufficient documentation

## 2016-02-16 DIAGNOSIS — R102 Pelvic and perineal pain: Secondary | ICD-10-CM | POA: Diagnosis not present

## 2016-02-16 DIAGNOSIS — T50905A Adverse effect of unspecified drugs, medicaments and biological substances, initial encounter: Secondary | ICD-10-CM | POA: Diagnosis not present

## 2016-02-16 DIAGNOSIS — R079 Chest pain, unspecified: Secondary | ICD-10-CM | POA: Diagnosis not present

## 2016-02-16 DIAGNOSIS — T887XXA Unspecified adverse effect of drug or medicament, initial encounter: Secondary | ICD-10-CM | POA: Diagnosis not present

## 2016-02-16 DIAGNOSIS — I10 Essential (primary) hypertension: Secondary | ICD-10-CM | POA: Diagnosis not present

## 2016-02-16 DIAGNOSIS — Z85828 Personal history of other malignant neoplasm of skin: Secondary | ICD-10-CM | POA: Diagnosis not present

## 2016-02-16 DIAGNOSIS — R402411 Glasgow coma scale score 13-15, in the field [EMT or ambulance]: Secondary | ICD-10-CM | POA: Diagnosis not present

## 2016-02-16 LAB — URINALYSIS, ROUTINE W REFLEX MICROSCOPIC
BILIRUBIN URINE: NEGATIVE
GLUCOSE, UA: NEGATIVE mg/dL
Ketones, ur: NEGATIVE mg/dL
Leukocytes, UA: NEGATIVE
Nitrite: NEGATIVE
PROTEIN: NEGATIVE mg/dL
Specific Gravity, Urine: 1.017 (ref 1.005–1.030)
pH: 6.5 (ref 5.0–8.0)

## 2016-02-16 LAB — COMPREHENSIVE METABOLIC PANEL
ALBUMIN: 3.7 g/dL (ref 3.5–5.0)
ALK PHOS: 61 U/L (ref 38–126)
ALT: 22 U/L (ref 17–63)
AST: 41 U/L (ref 15–41)
Anion gap: 10 (ref 5–15)
BILIRUBIN TOTAL: 1.2 mg/dL (ref 0.3–1.2)
BUN: 16 mg/dL (ref 6–20)
CALCIUM: 9.6 mg/dL (ref 8.9–10.3)
CO2: 26 mmol/L (ref 22–32)
CREATININE: 1.06 mg/dL (ref 0.61–1.24)
Chloride: 100 mmol/L — ABNORMAL LOW (ref 101–111)
GFR calc Af Amer: >60 mL/min (ref >60)
GLUCOSE: 126 mg/dL — AB (ref 65–99)
POTASSIUM: 4.3 mmol/L (ref 3.5–5.1)
Sodium: 136 mmol/L (ref 135–145)
TOTAL PROTEIN: 7.3 g/dL (ref 6.5–8.1)

## 2016-02-16 LAB — CBC WITH DIFFERENTIAL/PLATELET
BASOS ABS: 0 10*3/uL (ref 0.0–0.1)
BASOS PCT: 0 %
Eosinophils Absolute: 0 10*3/uL (ref 0.0–0.7)
Eosinophils Relative: 0 %
HEMATOCRIT: 43.6 % (ref 39.0–52.0)
HEMOGLOBIN: 15.2 g/dL (ref 13.0–17.0)
LYMPHS PCT: 9 %
Lymphs Abs: 0.9 10*3/uL (ref 0.7–4.0)
MCH: 30.9 pg (ref 26.0–34.0)
MCHC: 34.9 g/dL (ref 30.0–36.0)
MCV: 88.6 fL (ref 78.0–100.0)
MONO ABS: 0.7 10*3/uL (ref 0.1–1.0)
Monocytes Relative: 7 %
NEUTROS ABS: 8.2 10*3/uL — AB (ref 1.7–7.7)
NEUTROS PCT: 84 %
Platelets: 183 10*3/uL (ref 150–400)
RBC: 4.92 MIL/uL (ref 4.22–5.81)
RDW: 14.2 % (ref 11.5–15.5)
WBC: 9.8 10*3/uL (ref 4.0–10.5)

## 2016-02-16 LAB — URINE MICROSCOPIC-ADD ON

## 2016-02-16 LAB — I-STAT TROPONIN, ED: Troponin i, poc: 0.08 ng/mL (ref 0.00–0.08)

## 2016-02-16 LAB — CK: Total CK: 742 U/L — ABNORMAL HIGH (ref 49–397)

## 2016-02-16 MED ORDER — ACETAMINOPHEN 500 MG PO TABS: 1000.0000 mg | ORAL_TABLET | Freq: Four times a day (QID) | ORAL | 0 refills | Status: AC | PRN

## 2016-02-16 MED ORDER — SODIUM CHLORIDE 0.9 % IV BOLUS (SEPSIS)
1000.0000 mL | Freq: Once | INTRAVENOUS | Status: AC
  Administered 2016-02-16: 1000 mL via INTRAVENOUS

## 2016-02-16 NOTE — ED Triage Notes (Signed)
From home via EMS: son dropped him off at home last night at 8-9pm; had not heard from him today so went to check on him. Found in floor in living room, alert, but more confused than usual, incontinent of urine and stool. EMS called to get him up. Does not remember what happened. Denies any pain. No deformities or injuries noted. Oriented to self only. 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

## 2016-02-16 NOTE — ED Notes (Signed)
CBG in route 110

## 2016-02-16 NOTE — ED Notes (Signed)
Patient transported to X-ray & CT °

## 2016-02-16 NOTE — ED Provider Notes (Signed)
[place] DEPT Provider Note   CSN: [id number] Arrival date & time: 02/16/16  1321     History   Chief Complaint Chief Complaint  Patient presents with  . Altered Mental Status    HPI [name] is a 82 y.o. male.  The history is provided by a relative and the EMS personnel.  Weakness  Primary symptoms comment: weakness, found lying in floor. This is a recurrent problem. The current episode started 6 to 12 hours ago. The problem has been resolved. There was no focality noted. There has been no fever. Associated symptoms include confusion (chronic per family). Pertinent negatives include no headaches. There were no medications administered prior to arrival. Associated medical issues include dementia. Associated medical issues do not include CVA.    Past Medical History:  Diagnosis Date  . Arthritis   . Carpal tunnel syndrome    new complaint 05/05/07 ov : rec  - trial of bilat wrist splints.  . Coronary artery disease    s/p6 vessel CABG 1999 Dr Jobie Quaker  . Hx of migraines   . Hypercholesterolemia   . Hypertension   . Kidney stones remote  . Low back pain   . Osteoarthrosis, unspecified whether generalized or localized, unspecified site   . 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 Seasonal allergies   . Skin cancer    followed by Dr Denna Haggard    Patient Active Problem List   Diagnosis Date Noted  . Polyarthralgia 02/12/2016  . Primary osteoarthritis of both hands 10/03/2014  . CARPAL TUNNEL SYNDROME, MILD 05/05/2007  . BYSSINOSIS 05/05/2007  . Cancer of skin of right lower leg 05/05/2007  . Pure hypercholesterolemia 03/05/2007  . Essential hypertension 03/05/2007  . CAD (coronary artery disease) of artery bypass graft 03/05/2007  . DEGENERATIVE JOINT DISEASE 03/05/2007  . LOW BACK PAIN, CHRONIC 03/05/2007  . CORONARY ARTERY  BYPASS GRAFT, HX OF 03/05/2007    Past Surgical History:  Procedure Laterality Date  . CORONARY ARTERY BYPASS GRAFT     1999 DR Jobie Quaker    OB History    No data available       Home Medications    Prior to Admission medications   Medication Sig Start Date End Date Taking? Authorizing Provider  acetaminophen (TYLENOL) 500 MG tablet Take 500 mg by mouth 3 (three) times daily as needed.    Historical Provider, MD  aspirin EC 81 MG tablet Take 81 mg by mouth daily.    Historical Provider, MD  Cholecalciferol (VITAMIN D) 2000 units CAPS Take 1 capsule by mouth daily.    Historical Provider, MD  Coenzyme Q10 (CO Q10) 100 MG CAPS Take 4 capsules by mouth.    Historical Provider, MD  Multiple Vitamin (MULTIVITAMIN) tablet Take 1 tablet by mouth daily.    Historical Provider, MD  Omega-3 Fatty Acids (FISH OIL) 1000 MG CPDR Take 3 tablets by mouth.    Historical Provider, MD  traMADol (ULTRAM) 50 MG tablet Take 0.5-1 tablets (25-50 mg total) by mouth 2 (two) times daily as needed. 02/12/16   Ria Bush, MD    Family History Family History  Problem Relation Age of Onset  . Heart failure Mother   . Arthritis Mother   . Alzheimer's disease Mother   . Diabetes Mother   . Heart failure  Father   . Cancer Brother     lung (smoker)  . Parkinson's disease Brother     Social History Social History  Substance Use Topics  . Smoking status: Never Smoker  . Smokeless tobacco: Never Used     Comment: pt stopped at age 36  . Alcohol use No     Allergies   Review of patient's allergies indicates no known allergies.   Review of Systems Review of Systems  Neurological: Positive for weakness. Negative for headaches.  Psychiatric/Behavioral: Positive for confusion (chronic per family).  All other systems reviewed and are negative.    Physical Exam Updated Vital Signs BP 152/76 (BP Location: Right Arm)   Pulse 78   Temp 98.1 F (36.7 C) (Rectal)   Resp 20   Ht 5\' 7"   (1.702 m)   Wt 165 lb (74.8 kg)   SpO2 98%   BMI 25.84 kg/m   Physical Exam  Constitutional: He appears well-developed and well-nourished. No distress.  HENT:  Head: Normocephalic and atraumatic.  Eyes: Conjunctivae are normal.  Neck: Neck supple. No tracheal deviation present.  Cardiovascular: Normal rate, regular rhythm and normal heart sounds.   Pulmonary/Chest: Effort normal and breath sounds normal. No respiratory distress. He exhibits no tenderness.  Abdominal: Soft. He exhibits no distension. There is no tenderness. There is no rebound and no guarding.  Musculoskeletal:  No deformities, no bruising or lacerations to extremities. No tenderness or evidence of trauma to the back.  Neurological: He is alert. He has normal strength. He is disoriented (oriented to self). Coordination normal.  Skin: Skin is warm and dry.  Psychiatric: He has a normal mood and affect.  Vitals reviewed.    ED Treatments / Results  Labs (all labs ordered are listed, but only abnormal results are displayed) Labs Reviewed  CBC WITH DIFFERENTIAL/PLATELET - Abnormal; Notable for the following:       Result Value   Neutro Abs 8.2 (*)    All other components within normal limits  COMPREHENSIVE METABOLIC PANEL - Abnormal; Notable for the following:    Chloride 100 (*)    Glucose, Bld 126 (*)    All other components within normal limits  URINALYSIS, ROUTINE W REFLEX MICROSCOPIC (NOT AT [facility]) - Abnormal; Notable for the following:    Hgb urine dipstick LARGE (*)    All other components within normal limits  CK - Abnormal; Notable for the following:    Total CK 742 (*)    All other components within normal limits  URINE MICROSCOPIC-ADD ON - Abnormal; Notable for the following:    Squamous Epithelial / LPF 0-5 (*)    Bacteria, UA FEW (*)    All other components within normal limits  URINE CULTURE  I-STAT TROPOININ, ED    EKG  EKG Interpretation  Date/Time:  Saturday February 16 2016 13:30:35  EDT Ventricular Rate:  78 PR Interval:    QRS Duration: 98 QT Interval:  449 QTC Calculation: 512 R Axis:   -45 Text Interpretation:  Sinus rhythm LAD, consider left anterior fascicular block Borderline T wave abnormalities Abnormal ECG No significant change since last tracing Interpretation limited secondary to artifact Confirmed by Ogden Handlin MD, Quillian Quince [id number]) on 02/16/2016 2:39:19 PM       Radiology Dg Chest 2 View  Result Date: 02/16/2016 CLINICAL DATA:  Found in floor in living room, alert, but more confused than usual, incontinent of urine and stool. EMS called to get him up. Does not remember what happened.  Denies any pain. EXAM: CHEST - 2 VIEW COMPARISON:  02/08/2008 FINDINGS: Mild bibasilar interstitial edema or infiltrates. Heart size upper limits normal.  Tortuous atheromatous aorta. No effusion.  No pneumothorax. Previous CABG. Degenerative changes in bilateral shoulders. Spondylitic changes in the thoracolumbar spine. IMPRESSION: 1. Mild bibasilar interstitial edema or infiltrates, new since previous. Electronically Signed   By: Lucrezia Europe M.D.   On: 02/16/2016 15:04   Dg Pelvis 1-2 Views  Result Date: 02/16/2016 CLINICAL DATA:  Found in floor in living room, alert, but more confused than usual, incontinent of urine and stool. EMS called to get him up. Does not remember what happened. Denies any pain. EXAM: PELVIS - 1-2 VIEW COMPARISON:  None. FINDINGS: There is no evidence of pelvic fracture or diastasis. No pelvic bone lesions are seen. Degenerative disc disease in the visualized lower lumbar spine. IMPRESSION: 1. Negative pelvis. 2. Lower lumbar degenerative disc disease. Electronically Signed   By: Lucrezia Europe M.D.   On: 02/16/2016 15:05   Ct Head Wo Contrast  Result Date: 02/16/2016 CLINICAL DATA:  Altered mental status with incontinence and confusion EXAM: CT HEAD WITHOUT CONTRAST TECHNIQUE: Contiguous axial images were obtained from the base of the skull through the vertex  without intravenous contrast. COMPARISON:  None. FINDINGS: Brain: There is moderate diffuse atrophy. There is no intracranial mass, hemorrhage, extra-axial fluid collection, or midline shift. There is patchy small vessel disease in the centra semiovale bilaterally. No acute infarct is evident. Vascular: The middle cerebral artery show symmetric attenuation bilaterally. No hyperdense vessel is evident. There are foci of calcification in each distal vertebral artery. There is also calcification in each carotid siphon. Skull: The bony calvarium appears intact. Sinuses/Orbits: There is mucosal thickening in several ethmoid air cells bilaterally. Frontal sinuses are nearly aplastic. Visualized paranasal sinuses elsewhere clear. Orbits appear symmetric bilaterally. Other: Mastoid air cells are clear. IMPRESSION: Moderate atrophy with patchy periventricular small vessel disease. No acute infarct evident. No hemorrhage or mass effect. There are areas of paranasal sinus disease. There are foci of arterial vascular calcification. Electronically Signed   By: Lowella Grip III M.D.   On: 02/16/2016 15:33    Procedures Procedures (including critical care time)  Medications Ordered in ED Medications  sodium chloride 0.9 % bolus 1,000 mL (0 mLs Intravenous Stopped 02/16/16 1624)     Initial Impression / Assessment and Plan / ED Course  I have reviewed the triage vital signs and the nursing notes.  Pertinent labs & imaging results that were available during my care of the patient were reviewed by me and considered in my medical decision making (see chart for details).  Clinical Course   82 y.o. male presents with Being found in the floor by his family member. Last seen normal last night. They have had issues with him being confused and he is currently at his baseline per family. He does not recall any fall but is only oriented to self. No external signs of injury currently. CT head and x-rays of chest and  pelvis are negative for injury, ischemia, bleeding. Labs are not consistent with acute infection. No significant hematologic or metabolic abnormalities to explain symptoms. Mild CK elevation noted and given 1 L of IV fluids to help clear. Family is in agreement to check on patient closely, they were offered home health referral but refused currently as they did not feel patient would be compliant with this.  Of note, the patient started on tramadol recently for chronic pain. I'm concerned  this may be a result of medication side effect with increased weakness. I recommended increasing Tylenol to make up pain control for discontinuation of tramadol until able to follow-up with primary care physician in 2 days for a recheck of patient and CK.  Final Clinical Impressions(s) / ED Diagnoses   Final diagnoses:  Confusion  Frail elderly  Medication side effect, initial encounter    New Prescriptions Current Discharge Medication List       Leo Grosser, MD 02/16/16 [phone]

## 2016-02-16 NOTE — ED Notes (Signed)
Patient returned from Radiology. 

## 2016-02-17 LAB — URINE CULTURE

## 2016-02-18 ENCOUNTER — Telehealth: Payer: Self-pay | Admitting: Family Medicine

## 2016-02-18 NOTE — Telephone Encounter (Signed)
Adam called to make an er follow up for [name].  He stated [name] Emami had an allergic reaction to come med's dr g prescribed.  The next 30 min appointment we dr g was 10/4 he wanted to know if he could be seen sooner

## 2016-02-18 NOTE — Telephone Encounter (Signed)
Pt spoke with carre Changed to 9/27

## 2016-02-18 NOTE — Telephone Encounter (Signed)
Left message asking adam to call office

## 2016-02-18 NOTE — Telephone Encounter (Signed)
He can be sooner by someone else in the office. Dr. Darnell Level is out of the office 3 days this week, so he just doesn't have anything any sooner, unfortunately.

## 2016-02-20 ENCOUNTER — Ambulatory Visit (INDEPENDENT_AMBULATORY_CARE_PROVIDER_SITE_OTHER): Payer: Medicare Other | Admitting: Family Medicine

## 2016-02-20 ENCOUNTER — Encounter: Payer: Self-pay | Admitting: Family Medicine

## 2016-02-20 ENCOUNTER — Telehealth: Payer: Self-pay | Admitting: Family Medicine

## 2016-02-20 VITALS — BP 118/70 | HR 76 | Temp 98.5°F | Wt 161.2 lb

## 2016-02-20 DIAGNOSIS — M2550 Pain in unspecified joint: Secondary | ICD-10-CM | POA: Diagnosis not present

## 2016-02-20 DIAGNOSIS — R748 Abnormal levels of other serum enzymes: Secondary | ICD-10-CM

## 2016-02-20 LAB — BASIC METABOLIC PANEL
BUN: 18 mg/dL (ref 6–23)
CALCIUM: 9.3 mg/dL (ref 8.4–10.5)
CHLORIDE: 101 meq/L (ref 96–112)
CO2: 31 meq/L (ref 19–32)
CREATININE: 1.08 mg/dL (ref 0.40–1.50)
GFR: 68.49 mL/min (ref >60.00)
GLUCOSE: 99 mg/dL (ref 70–99)
Potassium: 4.3 mEq/L (ref 3.5–5.1)
SODIUM: 138 meq/L (ref 135–145)

## 2016-02-20 LAB — CK: Total CK: 109 U/L (ref 7–232)

## 2016-02-20 NOTE — Progress Notes (Signed)
Pre visit review using our clinic review tool, if applicable. No additional management support is needed unless otherwise documented below in the visit note. 

## 2016-02-20 NOTE — Assessment & Plan Note (Signed)
Avoid opiates for now.  See AVS.  Don't take tramadol or ibuprofen.  Tylenol for pain.  If B vitamin helps the pain, then continue taking it.  I think PT could help his shoulder pain that is likely related to his rotator cuff.  He'll let us know if he wants to start PT.  Family will check on getting a fall button in the meantime.  At this point, okay for outpatient f/u.  >25 minutes spent in face to face time with patient, >50% spent in counselling or coordination of care.

## 2016-02-20 NOTE — Telephone Encounter (Signed)
FYI to PCP.  Patient was fasting on recent lipid panel.  Per family patient had intolerance to unrecalled statin prev.

## 2016-02-20 NOTE — Assessment & Plan Note (Signed)
Recheck today, likely from dehydration, yard work, being down overnight prior to ER eval.  Recheck today.  Avoid nsaids for now.

## 2016-02-20 NOTE — Progress Notes (Signed)
ER f/u.   Lives alone.  Has never has restrictions and any changes in his situation would be a sig change, per family here today.   Recently was going to work in the yard, Chiropractor.  Likely got overheated, per family report.  Family noted him outside, convinced him to go back in the house.  He seemed okay at the time.  Family found the next AM, likely hadn't made it to bed that night.   Lost control of urine overnight.  No known injury.  Sent to ER.  W/u neg except for CK elevation.  Here for f/u today.  He doesn't recall the night he was down.  He recalls some of the ER visit.   Had recently been on tramadol.  Unclear if that contributed.  Advised by ER docs to take tylenol for pain, not tramadol.  He has diffuse aches in the joints.  In the meantime, took tylenol.  Family wanted him to take B- complex for the pain, did once and that may have helped some.  D/w pt.  Still with knee and shoulder pain.  It will be tough to sort some of his pain out currently due to prev CK elevation.  Reports GI upset with nsaids.  He is most bothered by B shoulder pain.   Due for f/u labs.    PMH and SH reviewed  ROS: Per HPI unless specifically indicated in ROS section   Meds, vitals, and allergies reviewed.   nad ncat Mmm Neck supple no LA rrr ctab abd soft, normal BS Ext w/o edema S/S grossly wnl x4 B shoulder exam with limited overhead ROM.  Limited ext rotation, pain with int rotation, weakness of supraspinatus testing. Grip wnl.

## 2016-02-20 NOTE — Patient Instructions (Signed)
Go to the lab on the way out.  We'll contact you with your lab report. Don't take tramadol or ibuprofen.  Tylenol for pain.  If B vitamin helps the pain, then continue taking it.  Keep drinking plenty of fluids in the meantime.  I think PT could help your shoulder pain that is likely related to your rotator cuff.  Let us know if you want to start that.  Get a fall button in the meantime.  Take care.  Glad to see you.

## 2016-02-27 ENCOUNTER — Ambulatory Visit: Payer: Self-pay | Admitting: Internal Medicine

## 2016-02-27 ENCOUNTER — Telehealth: Payer: Self-pay | Admitting: Family Medicine

## 2016-02-27 ENCOUNTER — Ambulatory Visit (INDEPENDENT_AMBULATORY_CARE_PROVIDER_SITE_OTHER): Payer: Medicare Other | Admitting: Internal Medicine

## 2016-02-27 ENCOUNTER — Ambulatory Visit: Payer: Self-pay | Admitting: Family Medicine

## 2016-02-27 ENCOUNTER — Encounter: Payer: Self-pay | Admitting: Internal Medicine

## 2016-02-27 VITALS — BP 116/68 | HR 84 | Temp 98.5°F | Wt 160.2 lb

## 2016-02-27 DIAGNOSIS — R4182 Altered mental status, unspecified: Secondary | ICD-10-CM | POA: Diagnosis not present

## 2016-02-27 DIAGNOSIS — N390 Urinary tract infection, site not specified: Secondary | ICD-10-CM | POA: Diagnosis not present

## 2016-02-27 DIAGNOSIS — R531 Weakness: Secondary | ICD-10-CM | POA: Diagnosis not present

## 2016-02-27 LAB — POC URINALSYSI DIPSTICK (AUTOMATED)
BILIRUBIN UA: NEGATIVE
GLUCOSE UA: NEGATIVE
KETONES UA: NEGATIVE
Nitrite, UA: POSITIVE
PH UA: 6
Spec Grav, UA: >=1.03
Urobilinogen, UA: NEGATIVE

## 2016-02-27 MED ORDER — CIPROFLOXACIN HCL 250 MG PO TABS: 250.0000 mg | ORAL_TABLET | Freq: Two times a day (BID) | ORAL | 0 refills | Status: DC

## 2016-02-27 NOTE — Progress Notes (Signed)
Subjective:    Patient ID: [name], male    DOB: Aug 04, 1925, 82 y.o.   MRN: [id number]  HPI  Pt presents to the clinic today, accompanied by his daughter, with c/o increased confusion and generalized weakness. His daughter reports that his father typically walks every morning for exercise. During his walk yesterday, he was not able to make it back home because he felt like he was dragging his feet and was too weak. He was able to make it to his granddaughters house, who lives next door. His daughter reports that his appetite and fluid intake have decreased. He has had some nasal congestion, but denies sore throat, cough or chest congestion. Daughter reports he has a history of dementia (no documentation of this in the chart) and has been more confused lately. He is not taking anything for dementia.  He was seen in the ER 9/23 for confusion. CT scan showed small vessel disease but no acute abnormality. It was a question of being overheated vs medication adverse reaction.   Review of Systems      Past Medical History:  Diagnosis Date  . Arthritis   . Carpal tunnel syndrome    new complaint 05/05/07 ov : rec  - trial of bilat wrist splints.  . Coronary artery disease    s/p6 vessel CABG 1999 Dr Jobie Quaker  . Hx of migraines   . Hypercholesterolemia   . Hypertension   . Kidney stones remote  . Low back pain   . Osteoarthrosis, unspecified whether generalized or localized, unspecified site   . 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 Seasonal allergies   . Skin cancer    followed by Dr Denna Haggard    Current Outpatient Prescriptions  Medication Sig Dispense Refill  . acetaminophen (TYLENOL) 500 MG tablet Take 2 tablets (1,000 mg total) by mouth every 6 (six) hours as needed (pain). 30 tablet 0  . aspirin EC 81 MG tablet Take 81 mg by mouth daily.      Marland Kitchen CALCIUM-MAGNESIUM-VITAMIN D PO Take 1 tablet by mouth daily.    . Coenzyme Q10 (COQ10) 400 MG CAPS Take 400 mg by mouth daily.    . Menthol-Methyl Salicylate (MUSCLE RUB) 10-15 % CREA Apply 1 application topically every Saturday. Apply to hands and shoulders    . Multiple Vitamin (MULTIVITAMIN WITH MINERALS) TABS tablet Take 1 tablet by mouth daily.    . Omega-3 Fatty Acids (FISH OIL) 1000 MG CAPS Take 1,000 mg by mouth 3 (three) times daily.    Marland Kitchen OVER THE COUNTER MEDICATION Take 2 tablets by mouth daily. 2 different OTC herbal supplements for memory    . ciprofloxacin (CIPRO) 250 MG tablet Take 1 tablet (250 mg total) by mouth 2 (two) times daily. 14 tablet 0   No current facility-administered medications for this visit.     Allergies  Allergen Reactions  . Hydrocodone Other (See Comments)    Possible reaction to hydrocodone vs oxycodone after leg surgery.  Altered mental status, aggressive.    . Nsaids Other (See Comments)    GI upset.   . Statins Other (See Comments)    Myalgia (thinks crestor)  . Tramadol     Possible source for altered mental status    Family History  Problem Relation Age of Onset  .  Heart failure Mother   . Arthritis Mother   . Alzheimer's disease Mother   . Diabetes Mother   . Heart failure Father   . Cancer Brother     lung (smoker)  . Parkinson's disease Brother     Social History   Social History  . Marital status: Widowed    Spouse name: N/A  . Number of children: 2  . Years of education: N/A   Occupational History  . retired    Social History Main Topics  . Smoking status: Never Smoker  . Smokeless tobacco: Never Used     Comment: pt stopped at age 79  . Alcohol use No  . Drug use: No  . Sexual activity: Not on file   Other Topics Concern  . 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     Constitutional: Pt reports  fatigue. Denies fever, malaise, headache or abrupt weight changes.  HEENT: Pt reports nasal congestion. Denies eye pain, eye redness, ear pain, ringing in the ears, wax buildup, runny nose, bloody nose, or sore throat. Respiratory: Denies difficulty breathing, shortness of breath, cough or sputum production.   Cardiovascular: Denies chest pain, chest tightness, palpitations or swelling in the hands or feet.  Gastrointestinal: Denies abdominal pain, bloating, constipation, diarrhea or blood in the stool.  GU: Denies urgency, frequency, pain with urination, burning sensation, blood in urine, odor or discharge. Musculoskeletal: Pt reports generalized weakness.  Skin: Denies redness, rashes, lesions or ulcercations.  Neurological: Pt has difficulty with memory and issues with balance. Denies dizziness, difficulty with speech or problems with coordination.    No other specific complaints in a complete review of systems (except as listed in HPI above).  Objective:   Physical Exam   BP 116/68   Pulse 84   Temp 98.5 F (36.9 C) (Tympanic)   Wt 160 lb 4 oz (72.7 kg)   SpO2 96%   BMI 25.10 kg/m  Wt Readings from Last 3 Encounters:  02/27/16 160 lb 4 oz (72.7 kg)  02/20/16 161 lb 4 oz (73.1 kg)  02/16/16 165 lb (74.8 kg)    General: Appears his stated age, in NAD. HEENT: Head: normal shape and size; Right Ear: Tm's gray and intact, normal light reflex; Left Ear: cerumen impaction; Nose: mucosa boggy and moist, septum midline; Throat/Mouth: Teeth present, mucosa pink and moist, no exudate, lesions or ulcerations noted.  Cardiovascular: Normal rate and rhythm. S1,S2 noted.   Pulmonary/Chest: Normal effort and positive vesicular breath sounds. No respiratory distress. No wheezes, rales or ronchi noted.  Abdomen: Soft and nontender. Normal bowel sounds. No CVA tenderness noted. Musculoskeletal: Gait slow and shuffled. Neurological: Alert to person.    BMET    Component Value Date/Time    NA 138 02/20/2016 1451   K 4.3 02/20/2016 1451   CL 101 02/20/2016 1451   CO2 31 02/20/2016 1451   GLUCOSE 99 02/20/2016 1451   BUN 18 02/20/2016 1451   CREATININE 1.08 02/20/2016 1451   CALCIUM 9.3 02/20/2016 1451   GFRNONAA >60 02/16/2016 1352   GFRAA >60 02/16/2016 1352    Lipid Panel     Component Value Date/Time   CHOL 304 (H) 02/14/2016 0910   TRIG (H) 02/14/2016 0910    489.0 Triglyceride is over 400; calculations on Lipids are invalid.   HDL 33.40 (L) 02/14/2016 0910   CHOLHDL 9 02/14/2016 0910  VLDL 61.0 (H) 07/07/2013 0941   LDLCALC 108 (H) 06/20/2011 0929    CBC    Component Value Date/Time   WBC 9.8 02/16/2016 1352   RBC 4.92 02/16/2016 1352   HGB 15.2 02/16/2016 1352   HCT 43.6 02/16/2016 1352   PLT 183 02/16/2016 1352   MCV 88.6 02/16/2016 1352   MCH 30.9 02/16/2016 1352   MCHC 34.9 02/16/2016 1352   RDW 14.2 02/16/2016 1352   LYMPHSABS 0.9 02/16/2016 1352   MONOABS 0.7 02/16/2016 1352   EOSABS 0.0 02/16/2016 1352   BASOSABS 0.0 02/16/2016 1352    Hgb A1C No results found for: HGBA1C         Assessment & Plan:   Altered mental status and generalized weakness:  Urinalysis: 1+ leuks, 1+ blood and pos nitrites Will send urine culture Advised him to push fluids eRx for Cipro 250 mg BID x 7 days  If weakness persist, will consider home health PT If confusion persist, make appt with PCP to discuss dementia diagnosis, workup, treatment, etc Return precautions discussed with daughter  RTC as needed or if symptoms persist or worsen Webb Silversmith, NP

## 2016-02-27 NOTE — Patient Instructions (Signed)

## 2016-02-27 NOTE — Telephone Encounter (Signed)
Pt has appt with Avie Echevaria NP on 02/27/16 at 2:30.

## 2016-02-27 NOTE — Addendum Note (Signed)
Addended by: Lurlean Nanny on: 02/27/2016 04:07 PM   Modules accepted: Orders

## 2016-02-27 NOTE — Telephone Encounter (Signed)
°  Patient Name: [name]  DOB: [birth year]    Initial Comment Father has been having problems walking the past few days.    Nurse Assessment  Nurse: Julien Girt, RN, Almyra Free Date/Time Eilene Ghazi Time): 02/27/2016 10:10:12 AM  Confirm and document reason for call. If symptomatic, describe symptoms. You must click the next button to save text entered. ---Caller states her father has been having trouble walking the last few days. States he normally walks each morning for exercise and this morning he wasn't able to make it home because he got weak. Her dtr lives next door ( 1/8 mile) and he was able to reach her home. His appetite and fluid intake have decreased. Adds he does have Dementia and has been more confused but he is alert and oriented this morning.  Has the patient traveled out of the country within the last 30 days? ---Not Applicable  Does the patient have any new or worsening symptoms? ---Yes  Will a triage be completed? ---Yes  Related visit to physician within the last 2 weeks? ---No  Does the PT have any chronic conditions? (i.e. diabetes, asthma, etc.) ---Yes  List chronic conditions. ---Htn, Hx CABG, High Cholesterol, Hx Dementia  Is this a behavioral health or substance abuse call? ---No     Guidelines    Guideline Title Affirmed Question Affirmed Notes  Weakness (Generalized) and Fatigue [1] MODERATE weakness (i.e., interferes with work, school, normal activities) AND [2] cause unknown (Exceptions: weakness with acute minor illness, or weakness from poor fluid intake)    Final Disposition User   See Physician within 4 Hours (or PCP triage) Julien Girt, RN, Almyra Free    Comments  Conference call w/Grand dtr who was with the patient.   Referrals  REFERRED TO PCP OFFICE   Disagree/Comply: Comply

## 2016-03-01 LAB — URINE CULTURE

## 2016-03-02 ENCOUNTER — Observation Stay (HOSPITAL_COMMUNITY)
Admission: EM | Admit: 2016-03-02 | Discharge: 2016-03-04 | Disposition: A | Discharge status: Home / Self Care | Payer: Medicare Other | Attending: Internal Medicine | Admitting: Internal Medicine

## 2016-03-02 ENCOUNTER — Emergency Department (HOSPITAL_COMMUNITY): Payer: Medicare Other

## 2016-03-02 ENCOUNTER — Encounter (HOSPITAL_COMMUNITY): Payer: Self-pay

## 2016-03-02 DIAGNOSIS — Z951 Presence of aortocoronary bypass graft: Secondary | ICD-10-CM

## 2016-03-02 DIAGNOSIS — Z79899 Other long term (current) drug therapy: Secondary | ICD-10-CM | POA: Diagnosis not present

## 2016-03-02 DIAGNOSIS — R072 Precordial pain: Principal | ICD-10-CM | POA: Insufficient documentation

## 2016-03-02 DIAGNOSIS — I10 Essential (primary) hypertension: Secondary | ICD-10-CM | POA: Diagnosis not present

## 2016-03-02 DIAGNOSIS — I2510 Atherosclerotic heart disease of native coronary artery without angina pectoris: Secondary | ICD-10-CM | POA: Insufficient documentation

## 2016-03-02 DIAGNOSIS — R079 Chest pain, unspecified: Secondary | ICD-10-CM | POA: Diagnosis present

## 2016-03-02 DIAGNOSIS — R0789 Other chest pain: Secondary | ICD-10-CM | POA: Diagnosis not present

## 2016-03-02 DIAGNOSIS — N179 Acute kidney failure, unspecified: Secondary | ICD-10-CM

## 2016-03-02 DIAGNOSIS — Z85828 Personal history of other malignant neoplasm of skin: Secondary | ICD-10-CM | POA: Diagnosis not present

## 2016-03-02 DIAGNOSIS — Z7982 Long term (current) use of aspirin: Secondary | ICD-10-CM | POA: Diagnosis not present

## 2016-03-02 DIAGNOSIS — E7800 Pure hypercholesterolemia, unspecified: Secondary | ICD-10-CM | POA: Diagnosis present

## 2016-03-02 DIAGNOSIS — D638 Anemia in other chronic diseases classified elsewhere: Secondary | ICD-10-CM

## 2016-03-02 DIAGNOSIS — I25810 Atherosclerosis of coronary artery bypass graft(s) without angina pectoris: Secondary | ICD-10-CM | POA: Diagnosis not present

## 2016-03-02 DIAGNOSIS — N390 Urinary tract infection, site not specified: Secondary | ICD-10-CM | POA: Insufficient documentation

## 2016-03-02 LAB — CBC WITH DIFFERENTIAL/PLATELET
BASOS ABS: 0.1 10*3/uL (ref 0.0–0.1)
Basophils Relative: 1 %
EOS ABS: 0.1 10*3/uL (ref 0.0–0.7)
EOS PCT: 2 %
HCT: 39.4 % (ref 39.0–52.0)
HEMOGLOBIN: 12.8 g/dL — AB (ref 13.0–17.0)
LYMPHS PCT: 17 %
Lymphs Abs: 1.2 10*3/uL (ref 0.7–4.0)
MCH: 29.9 pg (ref 26.0–34.0)
MCHC: 32.5 g/dL (ref 30.0–36.0)
MCV: 92.1 fL (ref 78.0–100.0)
Monocytes Absolute: 0.6 10*3/uL (ref 0.1–1.0)
Monocytes Relative: 8 %
NEUTROS PCT: 74 %
Neutro Abs: 5.4 10*3/uL (ref 1.7–7.7)
PLATELETS: 234 10*3/uL (ref 150–400)
RBC: 4.28 MIL/uL (ref 4.22–5.81)
RDW: 14.3 % (ref 11.5–15.5)
WBC: 7.4 10*3/uL (ref 4.0–10.5)

## 2016-03-02 LAB — CK: CK TOTAL: 50 U/L (ref 49–397)

## 2016-03-02 LAB — URINE MICROSCOPIC-ADD ON
BACTERIA UA: NONE SEEN
SQUAMOUS EPITHELIAL / LPF: NONE SEEN
WBC UA: NONE SEEN WBC/hpf (ref 0–5)

## 2016-03-02 LAB — URINALYSIS, ROUTINE W REFLEX MICROSCOPIC
Bilirubin Urine: NEGATIVE
Glucose, UA: NEGATIVE mg/dL
Ketones, ur: NEGATIVE mg/dL
LEUKOCYTES UA: NEGATIVE
Nitrite: NEGATIVE
PROTEIN: NEGATIVE mg/dL
SPECIFIC GRAVITY, URINE: 1.015 (ref 1.005–1.030)
pH: 5.5 (ref 5.0–8.0)

## 2016-03-02 LAB — I-STAT TROPONIN, ED: Troponin i, poc: 0.01 ng/mL (ref 0.00–0.08)

## 2016-03-02 LAB — LIPID PANEL
Cholesterol: 213 mg/dL — ABNORMAL HIGH (ref 0–200)
HDL: 26 mg/dL — AB (ref >40)
LDL CALC: 156 mg/dL — AB (ref 0–99)
Total CHOL/HDL Ratio: 8.2 RATIO
Triglycerides: 153 mg/dL — ABNORMAL HIGH (ref <150)
VLDL: 31 mg/dL (ref 0–40)

## 2016-03-02 LAB — BASIC METABOLIC PANEL
ANION GAP: 9 (ref 5–15)
BUN: 17 mg/dL (ref 6–20)
CALCIUM: 9.2 mg/dL (ref 8.9–10.3)
CO2: 23 mmol/L (ref 22–32)
Chloride: 103 mmol/L (ref 101–111)
Creatinine, Ser: 1.17 mg/dL (ref 0.61–1.24)
GFR, EST NON AFRICAN AMERICAN: 54 mL/min — AB (ref >60)
Glucose, Bld: 107 mg/dL — ABNORMAL HIGH (ref 65–99)
POTASSIUM: 3.9 mmol/L (ref 3.5–5.1)
Sodium: 135 mmol/L (ref 135–145)

## 2016-03-02 LAB — TROPONIN I: Troponin I: <0.03 ng/mL (ref <0.03)

## 2016-03-02 MED ORDER — GI COCKTAIL ~~LOC~~: 30.0000 mL | Freq: Four times a day (QID) | ORAL | Status: DC | PRN

## 2016-03-02 MED ORDER — CIPROFLOXACIN HCL 500 MG PO TABS
250.0000 mg | ORAL_TABLET | Freq: Two times a day (BID) | ORAL | Status: DC
  Administered 2016-03-02 – 2016-03-03 (×2): 250 mg via ORAL
  Filled 2016-03-02 (×2): qty 1

## 2016-03-02 MED ORDER — SODIUM CHLORIDE 0.9 % IV SOLN
INTRAVENOUS | Status: DC
  Administered 2016-03-02: 20:00:00 via INTRAVENOUS

## 2016-03-02 MED ORDER — ONDANSETRON HCL 4 MG/2ML IJ SOLN: 4.0000 mg | Freq: Four times a day (QID) | INTRAMUSCULAR | Status: DC | PRN

## 2016-03-02 MED ORDER — ACETAMINOPHEN 325 MG PO TABS: 650.0000 mg | ORAL_TABLET | ORAL | Status: DC | PRN

## 2016-03-02 MED ORDER — HEPARIN SODIUM (PORCINE) 5000 UNIT/ML IJ SOLN
5000.0000 [IU] | Freq: Three times a day (TID) | INTRAMUSCULAR | Status: DC
  Administered 2016-03-02: 5000 [IU] via SUBCUTANEOUS
  Filled 2016-03-02: qty 1

## 2016-03-02 MED ORDER — FAMOTIDINE IN NACL 20-0.9 MG/50ML-% IV SOLN
20.0000 mg | Freq: Two times a day (BID) | INTRAVENOUS | Status: DC
  Administered 2016-03-02: 20 mg via INTRAVENOUS
  Filled 2016-03-02: qty 50

## 2016-03-02 NOTE — ED Notes (Signed)
Attempted report 

## 2016-03-02 NOTE — ED Notes (Signed)
Patient transported to X-ray 

## 2016-03-02 NOTE — H&P (Signed)
History and Physical    [name] [id number] DOB: 07-29-1925 DOA: 03/02/2016   PCP: Ria Bush, MD   Patient coming from:  Home   Chief Complaint: Chest Pain   HPI: [name] is a 82 y.o. male with medical history significant for HTN, HLD, CAD, dementia, recent UTI  on Cipro, presenting with bilateral shoulder pain and central substernal chest pain since 9 am while at [facility]. History is limited due to dementia, and is provided by family .  Denies diaphoresis,or radiation to the jaw or  left arm, described as 5 /10, Pain notworsened with deep inspiration, movement or exertion. Till presentation to the ED, he had no further episodes. Patient took ASA 324 mg on arrival, and then given NTG with some relief.  Denies any dizziness or falls. No syncope or presyncope.   Denies any cough. Denies any fever or chills. Denies any nausea, vomiting or abdominal pain. Appetite is decreased since his UTI on 9/23 . Denies any leg swelling or calf pain. Denies any headaches or vision changes. Denies any seizures HAs been more confused since his UTI. HAs not been seen by Cards for more than a decade. No recent long distance trips. Denies any new stressors. Not on hormonal therapy.  No new herbal supplements.Does not smoke  No ETOH or recreational drugs.  ED Course:  BP (!) 150/70 (BP Location: Right Arm)   Pulse 68   Temp 98.1 F (36.7 C) (Oral)   Resp 16   Ht 5\' 7"  (1.702 m)   Wt 70.9 kg (156 lb 3.2 oz)   SpO2 98%   BMI 24.46 kg/m    CMET remarkable for Cr 1.17 o/w normal  TN 0.01 EKG sinus rhythm without any acute coronary syndrome noted WBC  7.4, with hemoglobin of 12.8, platelets to 34. Glucose 107.  Current UA is pending. Received IV fluids. He is currently chest pain free. Review of Systems: As per HPI otherwise 10 point review of systems negative.   Past Medical History:  Diagnosis Date  . Arthritis   . Carpal tunnel syndrome    new complaint 05/05/07 ov : rec  - trial of  bilat wrist splints.  . Coronary artery disease    s/p6 vessel CABG 1999 Dr Jobie Quaker  . Hx of migraines   . Hypercholesterolemia   . Hypertension   . Kidney stones remote  . Low back pain   . Osteoarthrosis, unspecified whether generalized or localized, unspecified site   . 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 Seasonal allergies   . Skin cancer    followed by Dr Denna Haggard    Past Surgical History:  Procedure Laterality Date  . CORONARY ARTERY BYPASS GRAFT     1999 DR Jobie Quaker    Social History Social History   Social History  . Marital status: Widowed    Spouse name: N/A  . Number of children: 2  . Years of education: N/A   Occupational History  . retired    Social History Main Topics  . Smoking status: Never Smoker  . Smokeless tobacco: Never Used     Comment: pt stopped at age 53  . Alcohol use No  . Drug use: No  . Sexual activity: Not on file   Other Topics Concern  . 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     Allergies  Allergen Reactions  . Hydrocodone Other (See Comments)    Possible reaction to hydrocodone vs oxycodone after leg surgery.  Altered mental status, aggressive.    . Tramadol Other (See Comments)    Possible source for altered mental status  . Nsaids Other (See Comments)    GI upset.   . Statins Other (See Comments)    Myalgia (thinks crestor)    Family History  Problem Relation Age of Onset  . Heart failure Mother   . Arthritis Mother   . Alzheimer's disease Mother   . Diabetes Mother   . Heart failure Father   . Cancer Brother     lung (smoker)  . Parkinson's disease Brother       Prior to Admission medications   Medication Sig Start Date End Date Taking? Authorizing  Provider  aspirin EC 81 MG tablet Take 81 mg by mouth daily.   Yes Historical Provider, MD  CALCIUM-MAGNESIUM-VITAMIN D PO Take 1 tablet by mouth daily.   Yes Historical Provider, MD  ciprofloxacin (CIPRO) 250 MG tablet Take 1 tablet (250 mg total) by mouth 2 (two) times daily. 02/27/16  Yes Jearld Fenton, NP  Coenzyme Q10 (COQ10) 400 MG CAPS Take 400 mg by mouth daily.   Yes Historical Provider, MD  Menthol-Methyl Salicylate (MUSCLE RUB) 10-15 % CREA Apply 1 application topically every Saturday. Apply to hands and shoulders   Yes Historical Provider, MD  Multiple Vitamin (MULTIVITAMIN WITH MINERALS) TABS tablet Take 1 tablet by mouth daily.   Yes Historical Provider, MD  Omega-3 Fatty Acids (FISH OIL) 1000 MG CAPS Take 1,000 mg by mouth 2 (two) times daily.    Yes Historical Provider, MD  OVER THE COUNTER MEDICATION Take 2 tablets by mouth daily. 2 different OTC herbal supplements for memory   Yes Historical Provider, MD  acetaminophen (TYLENOL) 500 MG tablet Take 2 tablets (1,000 mg total) by mouth every 6 (six) hours as needed (pain). Patient taking differently: Take 500 mg by mouth every 6 (six) hours as needed for moderate pain or headache.  02/16/16   Leo Grosser, MD    Physical Exam:    Vitals:   03/02/16 1330 03/02/16 1430 03/02/16 1500 03/02/16 1545  BP: 131/61 126/72 125/67 (!) 150/70  Pulse: (!) 55 (!) 53 62 68  Resp: 15 16 13 16   Temp:    98.1 F (36.7 C)  TempSrc:    Oral  SpO2: 98% 97% 98% 98%  Weight:    70.9 kg (156 lb 3.2 oz)  Height:    5\' 7"  (1.702 m)       Constitutional: NAD, calm, comfortable, pleasantly confused. Vitals:   03/02/16 1330 03/02/16 1430 03/02/16 1500 03/02/16 1545  BP: 131/61 126/72 125/67 (!) 150/70  Pulse: (!) 55 (!) 53 62 68  Resp: 15 16 13 16   Temp:    98.1 F (36.7 C)  TempSrc:    Oral  SpO2: 98% 97% 98% 98%  Weight:    70.9 kg (156 lb 3.2 oz)  Height:    5\' 7"  (1.702 m)   Eyes: PERRL, lids and conjunctivae normal ENMT: Mucous  membranes are moist. Posterior pharynx clear of any exudate or lesions.Normal dentition.  Neck: normal, supple, no masses, no thyromegaly Respiratory: clear to auscultation bilaterally, no wheezing, no crackles. Normal respiratory  effort. No accessory muscle use.  Cardiovascular: Regular rate and rhythm, no murmurs / rubs / gallops. No extremity edema. 2+ pedal pulses. No carotid bruits.  Abdomen: no tenderness, no masses palpated. No hepatosplenomegaly. Bowel sounds positive.  Musculoskeletal: no clubbing / cyanosis. No joint deformity upper and lower extremities. Good ROM, no contractures. Normal muscle tone.  Skin: no rashes, lesions, ulcers.  Neurologic: Known dementia, awake, follows simple commands . DTR normal. Strength 5/5 in all 4.    Labs on Admission: I have personally reviewed following labs and imaging studies  CBC:  Recent Labs Lab 03/02/16 1214  WBC 7.4  NEUTROABS 5.4  HGB 12.8*  HCT 39.4  MCV 92.1  PLT [id number]    Basic Metabolic Panel:  Recent Labs Lab 03/02/16 1214  NA 135  K 3.9  CL 103  CO2 23  GLUCOSE 107*  BUN 17  CREATININE 1.17  CALCIUM 9.2    GFR: Estimated Creatinine Clearance: 40.8 mL/min (by C-G formula based on SCr of 1.17 mg/dL).  Liver Function Tests: No results for input(s): AST, ALT, ALKPHOS, BILITOT, PROT, ALBUMIN in the last 168 hours. No results for input(s): LIPASE, AMYLASE in the last 168 hours. No results for input(s): AMMONIA in the last 168 hours.  Coagulation Profile: No results for input(s): INR, PROTIME in the last 168 hours.  Cardiac Enzymes: No results for input(s): CKTOTAL, CKMB, CKMBINDEX, TROPONINI in the last 168 hours.  BNP (last 3 results) No results for input(s): PROBNP in the last 8760 hours.  HbA1C: No results for input(s): HGBA1C in the last 72 hours.  CBG: No results for input(s): GLUCAP in the last 168 hours.  Lipid Profile: No results for input(s): CHOL, HDL, LDLCALC, TRIG, CHOLHDL, LDLDIRECT in the  last 72 hours.  Thyroid Function Tests: No results for input(s): TSH, T4TOTAL, FREET4, T3FREE, THYROIDAB in the last 72 hours.  Anemia Panel: No results for input(s): VITAMINB12, FOLATE, FERRITIN, TIBC, IRON, RETICCTPCT in the last 72 hours.  Urine analysis:    Component Value Date/Time   COLORURINE YELLOW 03/02/2016 1320   APPEARANCEUR CLEAR 03/02/2016 1320   LABSPEC 1.015 03/02/2016 1320   PHURINE 5.5 03/02/2016 1320   GLUCOSEU NEGATIVE 03/02/2016 1320   HGBUR TRACE (A) 03/02/2016 1320   BILIRUBINUR NEGATIVE 03/02/2016 1320   BILIRUBINUR neg 02/27/2016 1605   KETONESUR NEGATIVE 03/02/2016 1320   PROTEINUR NEGATIVE 03/02/2016 1320   UROBILINOGEN negative 02/27/2016 1605   NITRITE NEGATIVE 03/02/2016 1320   LEUKOCYTESUR NEGATIVE 03/02/2016 1320    Sepsis Labs: @LABRCNTIP (procalcitonin:4,lacticidven:4) ) Recent Results (from the past 240 hour(s))  Urine culture     Status: None (Preliminary result)   Collection Time: 02/27/16  4:00 PM  Result Value Ref Range Status   Colony Count Greater than 100,000 CFU/mL  Preliminary   Preliminary Report STAPHYLOCOCCUS SPECIES  Preliminary     Radiological Exams on Admission: Dg Chest 2 View  Result Date: 03/02/2016 CLINICAL DATA:  Mid chest pain intermittent over the last week. EXAM: CHEST  2 VIEW COMPARISON:  02/16/2016 FINDINGS: Previous median sternotomy and CABG. Mild cardiomegaly. Probable chronic obstructive lung disease with scarring at the bases. At the left base, there appears to be some volume loss and a small effusion not seen previously. This could represent pneumonia. IMPRESSION: Previous CABG. Chronic lung disease. Newly seen density at the left base which could be left base pneumonia with a small effusion. Electronically Signed   By: Nelson Chimes M.D.   On: 03/02/2016 12:44    EKG:  Independently reviewed.  Assessment/Plan Active Problems:   Chest pain   Pure hypercholesterolemia   Essential hypertension   CAD  (coronary artery disease) of artery bypass graft   CORONARY ARTERY BYPASS GRAFT, HX OF   UTI (urinary tract infection)   Anemia of chronic disease   AKI (acute kidney injury) (HCC)   Chest pain syndrome, cardiac versus musculoskeletal TN 0.01 EKG sinus rhythm without any acute coronary syndrome noted. Received IV fluids, ASA 324,  He is currently chest pain free. HEART score 3-4 .  CXR trace LLl effusion otherwise unrevealing.    Admit to Telemetry/ Observation Chest pain order set Cycle troponins EKG in am continue ASA, O2 and NTG as needed GI cocktail Check Lipid panel  Hb A1C Consult to Cards if pain is not relieved   Anemia of chronic disease Hemoglobin  on admission 12.8,   drop since 9/21 at 15.2, in the setting of recent UTI, antibiotics and dehydration, decrease nutrition . Hcult pending.  Repeat CBC in am   Acute Kidney Injury likely due to dehydration vs. Recent UTI. Confused. Cr 1.17   , WBC normal. Afebrile   Lab Results  Component Value Date   CREATININE 1.17 03/02/2016   CREATININE 1.08 02/20/2016   CREATININE 1.06 02/16/2016  IVF CMET in am  I/O and daily weights Check UA  Continue Cipro  Check CK     Hypertension BP (!) 150/70 (BP Location: Right Arm)   Pulse 68   Temp 98.1 F (36.7 C) (Oral)   Resp 16   Ht 5\' 7"  (1.702 m)   Wt 70.9 kg (156 lb 3.2 oz)   SpO2 98%   BMI 24.46 kg/m  Controlled Continue to monitor, he was not on BP meds as OP      DVT prophylaxis: Heparin   Code Status:     DNR Family Communication:  Discussed with patient family  Disposition Plan: Expect patient to be discharged to home after condition improves. Social Work consult for home health care needs  Consults called:    None Admission status:Tele  Obs    Gautam Langhorst E, PA-C [facility]   If 7PM-7AM, please contact night-coverage [email or web address] Password [facility]  03/02/2016, 3:49 PM

## 2016-03-02 NOTE — ED Triage Notes (Signed)
To room via EMS.  Onset 9am chest pain and bilateral shoulder pain.  EMS gave ASA 324 mg.  NO c/o chest pain at this time.

## 2016-03-02 NOTE — ED Provider Notes (Signed)
[place] [facility] DEPT Provider Note   CSN: [id number] Arrival date & time: 03/02/16  1150     History   Chief Complaint Chief Complaint  Patient presents with  . Chest Pain    HPI [name] is a 82 y.o. male.  The history is provided by the patient and a relative.  Chest Pain   This is a new problem. The current episode started 1 to 2 hours ago. The problem occurs rarely. The problem has been resolved. Associated with: being at church, no exertion. The pain is present in the substernal region. The pain is moderate. The quality of the pain is described as pressure-like. Radiates to: bilateral arms. Duration of episode(s) is 1 hour. Pertinent negatives include no back pain, no shortness of breath, no syncope and no vomiting. Risk factors include being elderly.  His past medical history is significant for CAD (s/p CABG 20 y/a).    Past Medical History:  Diagnosis Date  . Arthritis   . Carpal tunnel syndrome    new complaint 05/05/07 ov : rec  - trial of bilat wrist splints.  . Coronary artery disease    s/p6 vessel CABG 1999 Dr Jobie Quaker  . Hx of migraines   . Hypercholesterolemia   . Hypertension   . Kidney stones remote  . Low back pain   . Osteoarthrosis, unspecified whether generalized or localized, unspecified site   . 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 Seasonal allergies   . Skin cancer    followed by Dr Denna Haggard    Patient Active Problem List   Diagnosis Date Noted  . Elevated CK 02/20/2016  . Polyarthralgia 02/12/2016  . Primary osteoarthritis of both hands 10/03/2014  . CARPAL TUNNEL SYNDROME, MILD 05/05/2007  . BYSSINOSIS 05/05/2007  . Cancer of skin of right lower leg 05/05/2007  . Pure hypercholesterolemia 03/05/2007  . Essential hypertension 03/05/2007  . CAD (coronary artery disease) of artery bypass graft  03/05/2007  . DEGENERATIVE JOINT DISEASE 03/05/2007  . LOW BACK PAIN, CHRONIC 03/05/2007  . CORONARY ARTERY BYPASS GRAFT, HX OF 03/05/2007    Past Surgical History:  Procedure Laterality Date  . CORONARY ARTERY BYPASS GRAFT     1999 DR Gearhardt       Home Medications    Prior to Admission medications   Medication Sig Start Date End Date Taking? Authorizing Provider  acetaminophen (TYLENOL) 500 MG tablet Take 2 tablets (1,000 mg total) by mouth every 6 (six) hours as needed (pain). 02/16/16   Leo Grosser, MD  aspirin EC 81 MG tablet Take 81 mg by mouth daily.    Historical Provider, MD  CALCIUM-MAGNESIUM-VITAMIN D PO Take 1 tablet by mouth daily.    Historical Provider, MD  ciprofloxacin (CIPRO) 250 MG tablet Take 1 tablet (250 mg total) by mouth 2 (two) times daily. 02/27/16   Jearld Fenton, NP  Coenzyme Q10 (COQ10) 400 MG CAPS Take 400 mg by mouth daily.    Historical Provider, MD  Menthol-Methyl Salicylate (MUSCLE RUB) 10-15 % CREA Apply 1 application topically every Saturday. Apply to hands and shoulders    Historical Provider, MD  Multiple Vitamin (MULTIVITAMIN WITH MINERALS) TABS tablet Take 1 tablet by mouth daily.    Historical Provider, MD  Omega-3 Fatty Acids (FISH OIL) 1000 MG CAPS Take 1,000 mg  by mouth 3 (three) times daily.    Historical Provider, MD  OVER THE COUNTER MEDICATION Take 2 tablets by mouth daily. 2 different OTC herbal supplements for memory    Historical Provider, MD    Family History Family History  Problem Relation Age of Onset  . Heart failure Mother   . Arthritis Mother   . Alzheimer's disease Mother   . Diabetes Mother   . Heart failure Father   . Cancer Brother     lung (smoker)  . Parkinson's disease Brother     Social History Social History  Substance Use Topics  . Smoking status: Never Smoker  . Smokeless tobacco: Never Used     Comment: pt stopped at age 22  . Alcohol use No     Allergies   Hydrocodone; Nsaids; Statins; and  Tramadol   Review of Systems Review of Systems  Respiratory: Negative for shortness of breath.   Cardiovascular: Positive for chest pain. Negative for syncope.  Gastrointestinal: Negative for vomiting.  Musculoskeletal: Negative for back pain.  All other systems reviewed and are negative.    Physical Exam Updated Vital Signs BP 123/79 (BP Location: Right Arm)   Pulse 72   Temp 98.3 F (36.8 C) (Oral)   Resp 18   SpO2 100%   Physical Exam  Constitutional: He is oriented to person, place, and time. He appears well-developed and well-nourished. No distress.  HENT:  Head: Normocephalic and atraumatic.  Nose: Nose normal.  Eyes: Conjunctivae are normal.  Neck: Neck supple. No tracheal deviation present.  Cardiovascular: Normal rate, regular rhythm and normal heart sounds.  Exam reveals no gallop and no friction rub.   No murmur heard. Pulmonary/Chest: Effort normal and breath sounds normal. No respiratory distress. He exhibits no tenderness.  Abdominal: Soft. He exhibits no distension. There is no tenderness.  Neurological: He is alert and oriented to person, place, and time.  Skin: Skin is warm and dry.  Psychiatric: He has a normal mood and affect.     ED Treatments / Results  Labs (all labs ordered are listed, but only abnormal results are displayed) Labs Reviewed  BASIC METABOLIC PANEL  CBC WITH DIFFERENTIAL/PLATELET  Randolm Idol, ED    EKG  EKG Interpretation  Date/Time:  Sunday March 02 2016 11:58:47 EDT Ventricular Rate:  70 PR Interval:    QRS Duration: 101 QT Interval:  422 QTC Calculation: 456 R Axis:   -46 Text Interpretation:  Sinus rhythm LAD, consider left anterior fascicular block No significant change since last tracing Confirmed by Josemiguel Gries MD, Brionne Mertz [phone]) on 03/02/2016 12:07:36 PM       Radiology Dg Chest 2 View  Result Date: 03/02/2016 CLINICAL DATA:  Mid chest pain intermittent over the last week. EXAM: CHEST  2 VIEW COMPARISON:   02/16/2016 FINDINGS: Previous median sternotomy and CABG. Mild cardiomegaly. Probable chronic obstructive lung disease with scarring at the bases. At the left base, there appears to be some volume loss and a small effusion not seen previously. This could represent pneumonia. IMPRESSION: Previous CABG. Chronic lung disease. Newly seen density at the left base which could be left base pneumonia with a small effusion. Electronically Signed   By: Nelson Chimes M.D.   On: 03/02/2016 12:44    Procedures Procedures (including critical care time)  Medications Ordered in ED Medications - No data to display   Initial Impression / Assessment and Plan / ED Course  I have reviewed the triage vital signs and the nursing  notes.  Pertinent labs & imaging results that were available during my care of the patient were reviewed by me and considered in my medical decision making (see chart for details).  Clinical Course    82 y.o. male presents with chest pain occurring at church. Also having some b/l arm pain. High risk by historical features and cardiac history. Troponin negative, EKG unchanged. Observation indicated for monitoring and serial troponins. Hospitalist was consulted for admission and will see the patient in the emergency department.   Final Clinical Impressions(s) / ED Diagnoses   Final diagnoses:  Chest pain, unspecified type    New Prescriptions New Prescriptions   No medications on file     Leo Grosser, MD 03/03/16 1123

## 2016-03-02 NOTE — ED Notes (Signed)
Patient has returned from being out of the department; patient placed back on monitor, continuous pulse oximetry and blood pressure cuff; visitors at bedside

## 2016-03-02 NOTE — ED Notes (Signed)
No lab draw,  Pt enroute to inpatient floor. 

## 2016-03-02 NOTE — ED Notes (Signed)
Patient undressed, in gown, on monitor, continuous pulse oximetry and blood pressure cuff 

## 2016-03-03 ENCOUNTER — Observation Stay (HOSPITAL_BASED_OUTPATIENT_CLINIC_OR_DEPARTMENT_OTHER): Payer: Medicare Other

## 2016-03-03 DIAGNOSIS — I10 Essential (primary) hypertension: Secondary | ICD-10-CM

## 2016-03-03 DIAGNOSIS — R079 Chest pain, unspecified: Secondary | ICD-10-CM | POA: Diagnosis not present

## 2016-03-03 DIAGNOSIS — N179 Acute kidney failure, unspecified: Secondary | ICD-10-CM | POA: Diagnosis not present

## 2016-03-03 DIAGNOSIS — I25700 Atherosclerosis of coronary artery bypass graft(s), unspecified, with unstable angina pectoris: Secondary | ICD-10-CM

## 2016-03-03 DIAGNOSIS — D638 Anemia in other chronic diseases classified elsewhere: Secondary | ICD-10-CM | POA: Diagnosis not present

## 2016-03-03 DIAGNOSIS — Z7982 Long term (current) use of aspirin: Secondary | ICD-10-CM | POA: Diagnosis not present

## 2016-03-03 DIAGNOSIS — I25768 Atherosclerosis of bypass graft of coronary artery of transplanted heart with other forms of angina pectoris: Secondary | ICD-10-CM | POA: Diagnosis not present

## 2016-03-03 DIAGNOSIS — I2510 Atherosclerotic heart disease of native coronary artery without angina pectoris: Secondary | ICD-10-CM | POA: Diagnosis not present

## 2016-03-03 DIAGNOSIS — E7800 Pure hypercholesterolemia, unspecified: Secondary | ICD-10-CM | POA: Diagnosis not present

## 2016-03-03 DIAGNOSIS — R072 Precordial pain: Secondary | ICD-10-CM | POA: Diagnosis not present

## 2016-03-03 LAB — CBC
HEMATOCRIT: 37.8 % — AB (ref 39.0–52.0)
HEMOGLOBIN: 12.4 g/dL — AB (ref 13.0–17.0)
MCH: 30 pg (ref 26.0–34.0)
MCHC: 32.8 g/dL (ref 30.0–36.0)
MCV: 91.3 fL (ref 78.0–100.0)
Platelets: 239 10*3/uL (ref 150–400)
RBC: 4.14 MIL/uL — AB (ref 4.22–5.81)
RDW: 14.1 % (ref 11.5–15.5)
WBC: 6 10*3/uL (ref 4.0–10.5)

## 2016-03-03 LAB — COMPREHENSIVE METABOLIC PANEL
ALBUMIN: 2.9 g/dL — AB (ref 3.5–5.0)
ALK PHOS: 57 U/L (ref 38–126)
ALT: 26 U/L (ref 17–63)
ANION GAP: 9 (ref 5–15)
AST: 27 U/L (ref 15–41)
BUN: 16 mg/dL (ref 6–20)
CO2: 27 mmol/L (ref 22–32)
Calcium: 9 mg/dL (ref 8.9–10.3)
Chloride: 100 mmol/L — ABNORMAL LOW (ref 101–111)
Creatinine, Ser: 1.14 mg/dL (ref 0.61–1.24)
GFR calc Af Amer: >60 mL/min (ref >60)
GFR calc non Af Amer: 55 mL/min — ABNORMAL LOW (ref >60)
GLUCOSE: 81 mg/dL (ref 65–99)
POTASSIUM: 4.1 mmol/L (ref 3.5–5.1)
SODIUM: 136 mmol/L (ref 135–145)
Total Bilirubin: 0.6 mg/dL (ref 0.3–1.2)
Total Protein: 6 g/dL — ABNORMAL LOW (ref 6.5–8.1)

## 2016-03-03 LAB — ECHOCARDIOGRAM COMPLETE
Height: 67 in
Weight: 2510.4 oz

## 2016-03-03 LAB — URINE CULTURE: Culture: NO GROWTH

## 2016-03-03 MED ORDER — ASPIRIN EC 81 MG PO TBEC
81.0000 mg | DELAYED_RELEASE_TABLET | Freq: Every day | ORAL | Status: DC
  Administered 2016-03-03: 81 mg via ORAL
  Filled 2016-03-03: qty 1

## 2016-03-03 MED ORDER — FAMOTIDINE 20 MG PO TABS
20.0000 mg | ORAL_TABLET | Freq: Two times a day (BID) | ORAL | Status: DC
  Administered 2016-03-03: 20 mg via ORAL
  Filled 2016-03-03: qty 1

## 2016-03-03 MED ORDER — LEVOFLOXACIN 750 MG PO TABS
750.0000 mg | ORAL_TABLET | ORAL | Status: DC
  Administered 2016-03-03: 750 mg via ORAL
  Filled 2016-03-03: qty 1

## 2016-03-03 MED ORDER — ISOSORBIDE MONONITRATE ER 30 MG PO TB24
15.0000 mg | ORAL_TABLET | Freq: Every day | ORAL | Status: DC
  Administered 2016-03-03: 15 mg via ORAL
  Filled 2016-03-03: qty 1

## 2016-03-03 NOTE — Progress Notes (Signed)
  Echocardiogram 2D Echocardiogram has been performed.  Darlina Sicilian M 03/03/2016, 9:10 AM

## 2016-03-03 NOTE — Progress Notes (Signed)
Pharmacy Antibiotic Note  [name] is a 82 y.o. male admitted on 03/02/2016 with CP.  Pt was being treated outpt with Cipro for UTI.  Now has developed some congestion with CXR suspicious for PNA.  Asked to change abx to Levaquin.    Plan: Levaquin 750mg  PO q48h x 3 doses   Height: 5\' 7"  (170.2 cm) Weight: 156 lb 14.4 oz (71.2 kg) IBW/kg (Calculated) : 66.1  Temp (24hrs), Avg:98.2 F (36.8 C), Min:98.1 F (36.7 C), Max:98.3 F (36.8 C)   Recent Labs Lab 03/02/16 1214 03/03/16 0321  WBC 7.4 6.0  CREATININE 1.17 1.14    Estimated Creatinine Clearance: 41.9 mL/min (by C-G formula based on SCr of 1.14 mg/dL).    Allergies  Allergen Reactions  . Hydrocodone Other (See Comments)    Possible reaction to hydrocodone vs oxycodone after leg surgery.  Altered mental status, aggressive.    . Tramadol Other (See Comments)    Possible source for altered mental status  . Nsaids Other (See Comments)    GI upset.   . Statins Other (See Comments)    Myalgia (thinks crestor)    Antimicrobials this admission: Cipro PTA >> 10/9 Levaquin 10/9 >> (10/13)  Dose adjustments this admission: none  Microbiology results: 10/8 urine  10/4 urine  > 100k staph  Thank you for allowing pharmacy to be a part of this patient's care.  [employer], Pharm.D., BCPS Clinical Pharmacist Pager [phone] 03/03/2016 10:24 AM

## 2016-03-03 NOTE — Care Management Obs Status (Signed)
[place] NOTIFICATION   Patient Details  Name: [name] MRN: [id number] Date of Birth: [birth year]   Medicare Observation Status Notification Given:       Bethena Roys, RN 03/03/2016, 12:16 PM

## 2016-03-03 NOTE — Evaluation (Signed)
Physical Therapy Evaluation Patient Details Name: [name] MRN: [id number] DOB: 1925/06/09 Today's Date: 03/03/2016   History of Present Illness  82 y.o. male admitted to [facility] on 03/02/16 with chest pain.  Dx with possible PNA, acute kidney injury, and dementia that seems to have gotten progressively worse per family at PT eval.  Pt with significant PMHx of OA, low back pain, HTN, CAD, and CABG.    Clinical Impression  Pt is a high fall risk and has increasing cognitive deficits even in his own home per grandson-in-law who was present and helping with PT evaluation.  I would not feel safe with this gentleman going home without 24/7 physical assist. SNF level rehab would be appropriate with transition to memory care ALF.  PT will follow acutely for deficits listed below.      Follow Up Recommendations SNF;Supervision/Assistance - 24 hour    Equipment Recommendations  None recommended by PT    Recommendations for Other Services   NA    Precautions / Restrictions Precautions Precautions: Fall Precaution Comments: h/o recent fall      Mobility  Bed Mobility               General bed mobility comments: Pt OOB in recliner chair  Transfers Overall transfer level: Needs assistance Equipment used: Rolling walker (2 wheeled) Transfers: Sit to/from Stand Sit to Stand: Mod assist         General transfer comment: Mod assist from lower recliner chair, posterior preference during transitions.  Significant diffulty backing up and controlling descent to sit down.   Ambulation/Gait Ambulation/Gait assistance: +2 safety/equipment;Mod assist Ambulation Distance (Feet): 150 Feet Assistive device: Rolling walker (2 wheeled) Gait Pattern/deviations: Step-through pattern;Shuffle;Trunk flexed Gait velocity: decreased   General Gait Details: Mod assist to support trunk for balance and help him safely manage RW.  Verbal cues to come inside RW and for hand placement (safe hand placement  ) on RW.  Near constant cues for safety.  Given his dementia, it might be worth trying to walk without RW if possible as this is a novel task.          Balance Overall balance assessment: Needs assistance Sitting-balance support: Feet supported;Bilateral upper extremity supported Sitting balance-Leahy Scale: Poor   Postural control: Posterior lean Standing balance support: Bilateral upper extremity supported Standing balance-Leahy Scale: Poor                               Pertinent Vitals/Pain Pain Assessment: No/denies pain    Home Living Family/patient expects to be discharged to:: Private residence Living Arrangements: Alone Available Help at Discharge: Family;Available PRN/intermittently Type of Home: House Home Access: Ramped entrance     Home Layout: One level;Laundry or work area in [place]: Environmental consultant - 2 wheels;Cane - single point;Wheelchair - manual      Prior Function Level of Independence: Independent         Comments: Pt drives to church and to [facility], family checks in regularly.  Family provides food/meals, pt heats up prepared meals.  Pt still mows his yard.  He is functional in his home environment and per grandson could not tell you the year, day, date, or president at baseline.          Extremity/Trunk Assessment   Upper Extremity Assessment: Generalized weakness           Lower Extremity Assessment: Generalized weakness  Cervical / Trunk Assessment: Kyphotic  Communication   Communication: HOH  Cognition Arousal/Alertness: Awake/alert Behavior During Therapy: Agitated;Impulsive (can be easily agitated) Overall Cognitive Status: History of cognitive impairments - at baseline (progressively worsening.  Worse out of his home environment)                      General Comments General comments (skin integrity, edema, etc.): Grandson-in-law providing hx and baseline        Assessment/Plan    PT  Assessment Patient needs continued PT services  PT Problem List Decreased strength;Decreased activity tolerance;Decreased balance;Decreased mobility;Decreased knowledge of use of DME;Decreased cognition;Decreased safety awareness          PT Treatment Interventions DME instruction;Gait training;Functional mobility training;Therapeutic activities;Balance training;Therapeutic exercise;Neuromuscular re-education;Cognitive remediation;Patient/family education    PT Goals (Current goals can be found in the Care Plan section)  Acute Rehab PT Goals Patient Stated Goal: none stated, grandson in law wants him to be safe wherever he ends up next PT Goal Formulation: With family Time For Goal Achievement: 03/17/16 Potential to Achieve Goals: Good    Frequency Min 3X/week   Barriers to discharge Decreased caregiver support Pt currently doesn't live with anyone, grandson in law doesn't know if family can afford 24/7 care or provide it themselves.         End of Session Equipment Utilized During Treatment: Gait belt Activity Tolerance: Patient limited by fatigue Patient left: in chair;with call bell/phone within reach;with family/visitor present      Functional Assessment Tool Used: assist level Functional Limitation: Mobility: Walking and moving around Mobility: Walking and Moving Around Current Status [id number]): At least 40 percent but less than 60 percent impaired, limited or restricted Mobility: Walking and Moving Around Goal Status [phone]): At least 1 percent but less than 20 percent impaired, limited or restricted    Time: [id number] PT Time Calculation (min) (ACUTE ONLY): 27 min   Charges:   PT Evaluation $PT Eval Moderate Complexity: 1 Procedure PT Treatments $Gait Training: 8-22 mins   PT G Codes:   PT G-Codes **NOT FOR INPATIENT CLASS** Functional Assessment Tool Used: assist level Functional Limitation: Mobility: Walking and moving around Mobility: Walking and Moving Around  Current Status [id number]): At least 40 percent but less than 60 percent impaired, limited or restricted Mobility: Walking and Moving Around Goal Status [phone]): At least 1 percent but less than 20 percent impaired, limited or restricted    Shawnna Pancake B. Shloma Roggenkamp, PT, DPT [phone]   03/03/2016, 5:35 PM

## 2016-03-03 NOTE — Consult Note (Signed)
   [name]   03/03/2016  [name] 1925/11/04 [id number]    [facility] Care Management referral received from inpatient [facility] for medication management and lives alone. Telephone call made to patient's daughter, [name] at [phone] to explain and offer [place] Management program services. [name] is currently confused. [name] states the plan is to go home at discharge not to SNF. [name] states [name] " would be in a straight jacket if he went to a rehab center". [name] reports she has an ulcer herself and has been caring for her dad for 17 years. States she can not move in with him at his home due to her own health issues. Reports [name] other daughter is too sickly to care for patient as well.   [name] also states her niece, [name] granddaughter [name], will be the one to call for post discharge calls. However she does not want Probation officer to contact [name] right now. [name] states she would rather for writer leave [place] Management packet in the room and family will overlook it. Made her aware that writer's contact information will be in the packet as well. Will follow up. Made inpatient RNCM aware of the above.   [name] Rolling, MSN-Ed, RN,BSN [facility] Liaison [phone]

## 2016-03-03 NOTE — Progress Notes (Signed)
PROGRESS NOTE    [name]  [id number] DOB: August 08, 1925 DOA: 03/02/2016 PCP: Ria Bush, MD   Outpatient Specialists:     Brief Narrative:  PRAISE [name] is a 82 y.o. male with a Past Medical History of hypertension, hyperlipidemia, coronary disease, dementia, recent urinary tract infection on ciprofloxacin presented bilateral shoulder pains and central substernal chest pain since 10:00 at church.  Upon arrival, patient denies any chest pain currently. Family states he does have pretty severe dementia, his mentation has been worsening, and are not certain what meds he takes.  They are certain he did take antibiotics for his UTI is a been monitoring that.   Assessment & Plan:   Active Problems:   Pure hypercholesterolemia   Essential hypertension   CAD (coronary artery disease) of artery bypass graft   CORONARY ARTERY BYPASS GRAFT, HX OF   Chest pain   Anemia of chronic disease   AKI (acute kidney injury) (HCC)   Chest pain syndrome, cardiac versus musculoskeletal vs PNA -CE negative -echo pending -conservative management per cards -treat PNA with short course of levaquin-- patient states he has been congested  Dementia -family does not live with patient but checks in on him frequently -considering hospice-- to speak with PCP -would like home health -advised not to drive and advised family not to allow him to drive  Anemia of chronic disease -follow as outpatient  Acute Kidney Injury stable   Hypertension BP  -stable      DVT prophylaxis:  SQ Heparin  Code Status: DNR   Family Communication:   Disposition Plan:     Consultants:   cards  Procedures:     Antimicrobials:      Subjective: Feeling well, no chest pain +congestion  Objective: Vitals:   03/02/16 1500 03/02/16 1545 03/02/16 2120 03/03/16 0532  BP: 125/67 (!) 150/70 (!) 142/71 (!) 116/57  Pulse: 62 68 64 60  Resp: 13 16 18 17   Temp:  98.1 F (36.7 C)  98.1 F (36.7 C) 98.3 F (36.8 C)  TempSrc:  Oral Oral Oral  SpO2: 98% 98% 99% 93%  Weight:  70.9 kg (156 lb 3.2 oz)  71.2 kg (156 lb 14.4 oz)  Height:  5\' 7"  (1.702 m)      Intake/Output Summary (Last 24 hours) at 03/03/16 1236 Last data filed at 03/03/16 1150  Gross per 24 hour  Intake            807.5 ml  Output             1140 ml  Net           -332.5 ml   Filed Weights   03/02/16 1545 03/03/16 0532  Weight: 70.9 kg (156 lb 3.2 oz) 71.2 kg (156 lb 14.4 oz)    Examination:  General exam: Appears calm and comfortable  Respiratory system: Clear to auscultation. Respiratory effort normal. Cardiovascular system: S1 & S2 heard, RRR. No JVD, murmurs, rubs, gallops or clicks. No pedal edema. Gastrointestinal system: Abdomen is nondistended, soft and nontender. No organomegaly or masses felt. Normal bowel sounds heard. Central nervous system: Alert   Data Reviewed: I have personally reviewed following labs and imaging studies  CBC:  Recent Labs Lab 03/02/16 1214 03/03/16 0321  WBC 7.4 6.0  NEUTROABS 5.4  --   HGB 12.8* 12.4*  HCT 39.4 37.8*  MCV 92.1 91.3  PLT 234 [id number]   Basic Metabolic Panel:  Recent Labs Lab 03/02/16 1214 03/03/16 0321  NA 135 136  K 3.9 4.1  CL 103 100*  CO2 23 27  GLUCOSE 107* 81  BUN 17 16  CREATININE 1.17 1.14  CALCIUM 9.2 9.0   GFR: Estimated Creatinine Clearance: 41.9 mL/min (by C-G formula based on SCr of 1.14 mg/dL). Liver Function Tests:  Recent Labs Lab 03/03/16 0321  AST 27  ALT 26  ALKPHOS 57  BILITOT 0.6  PROT 6.0*  ALBUMIN 2.9*   No results for input(s): LIPASE, AMYLASE in the last 168 hours. No results for input(s): AMMONIA in the last 168 hours. Coagulation Profile: No results for input(s): INR, PROTIME in the last 168 hours. Cardiac Enzymes:  Recent Labs Lab 03/02/16 1554 03/02/16 1755 03/02/16 2011  CKTOTAL 50  --   --   TROPONINI <0.03 <0.03 <0.03   BNP (last 3 results) No results for input(s):  PROBNP in the last 8760 hours. HbA1C: No results for input(s): HGBA1C in the last 72 hours. CBG: No results for input(s): GLUCAP in the last 168 hours. Lipid Profile:  Recent Labs  03/02/16 1548  CHOL 213*  HDL 26*  LDLCALC 156*  TRIG 153*  CHOLHDL 8.2   Thyroid Function Tests: No results for input(s): TSH, T4TOTAL, FREET4, T3FREE, THYROIDAB in the last 72 hours. Anemia Panel: No results for input(s): VITAMINB12, FOLATE, FERRITIN, TIBC, IRON, RETICCTPCT in the last 72 hours. Urine analysis:    Component Value Date/Time   COLORURINE YELLOW 03/02/2016 1320   APPEARANCEUR CLEAR 03/02/2016 1320   LABSPEC 1.015 03/02/2016 1320   PHURINE 5.5 03/02/2016 1320   GLUCOSEU NEGATIVE 03/02/2016 1320   HGBUR TRACE (A) 03/02/2016 1320   BILIRUBINUR NEGATIVE 03/02/2016 1320   BILIRUBINUR neg 02/27/2016 1605   KETONESUR NEGATIVE 03/02/2016 1320   PROTEINUR NEGATIVE 03/02/2016 1320   UROBILINOGEN negative 02/27/2016 1605   NITRITE NEGATIVE 03/02/2016 1320   LEUKOCYTESUR NEGATIVE 03/02/2016 1320     ) Recent Results (from the past 240 hour(s))  Urine culture     Status: None (Preliminary result)   Collection Time: 02/27/16  4:00 PM  Result Value Ref Range Status   Colony Count Greater than 100,000 CFU/mL  Preliminary   Preliminary Report STAPHYLOCOCCUS SPECIES  Preliminary  Urine culture     Status: None   Collection Time: 03/02/16  1:20 PM  Result Value Ref Range Status   Specimen Description URINE, CLEAN CATCH  Final   Special Requests NONE  Final   Culture NO GROWTH  Final   Report Status 03/03/2016 FINAL  Final      Anti-infectives    Start     Dose/Rate Route Frequency Ordered Stop   03/03/16 1200  levofloxacin (LEVAQUIN) tablet 750 mg     750 mg Oral Every 48 hours 03/03/16 1024 03/09/16 1159   03/02/16 2000  ciprofloxacin (CIPRO) tablet 250 mg  Status:  Discontinued     250 mg Oral 2 times daily 03/02/16 1451 03/03/16 1024       Radiology Studies: Dg Chest 2  View  Result Date: 03/02/2016 CLINICAL DATA:  Mid chest pain intermittent over the last week. EXAM: CHEST  2 VIEW COMPARISON:  02/16/2016 FINDINGS: Previous median sternotomy and CABG. Mild cardiomegaly. Probable chronic obstructive lung disease with scarring at the bases. At the left base, there appears to be some volume loss and a small effusion not seen previously. This could represent pneumonia. IMPRESSION: Previous CABG. Chronic lung disease. Newly seen density at the left base which could be left base pneumonia with a small  effusion. Electronically Signed   By: Nelson Chimes M.D.   On: 03/02/2016 12:44        Scheduled Meds: . aspirin EC  81 mg Oral Daily  . famotidine  20 mg Oral BID  . heparin  5,000 Units Subcutaneous Q8H  . isosorbide mononitrate  15 mg Oral Daily  . levofloxacin  750 mg Oral Q48H   Continuous Infusions:     LOS: 0 days    Time spent: 25 min    [place], DO [facility] Pager [phone]  If 7PM-7AM, please contact night-coverage [email or web address] Password [email or web address] 03/03/2016, 12:36 PM

## 2016-03-03 NOTE — Care Management Note (Addendum)
Case Management Note  Patient Details  Name: [name] MRN: [id number] Date of Birth: [birth year]  Subjective/Objective:   Pt presented for chest pain. Pt lives alone, however has a Niece [name] that lives across the street from him. Daughter [name] lives about 12 miles from him and assists with care as well.              Action/Plan: CM did call the daughter [name] in regards to home Health Services. Daughter needs to speak with her sister for agency choice. Agency list provided via phone. Pt has DME wc, rw and quad cane. No DME needs at this time. CM will continue to monitor..   Expected Discharge Date:                  Expected Discharge Plan:  Home/Self Care  In-House Referral:  NA  Discharge planning Services  CM Consult  Post Acute Care Choice:  Home Health Choice offered to:  Adult Children  DME Arranged:  N/A DME Agency:  NA  HH Arranged:  RN, PT, Social Work, Nurse's Aide [place] Agency:     Status of Service:  In process, will continue to follow  If discussed at Long Length of Stay Meetings, dates discussed:    Additional Comments: [place], RN,BSN [phone] CSW is working with pt in regards to SNF placement. No further needs from CM at this time.    1701 03-03-16 [name] Krauss, RN,BSN [phone] CM did call daughter [name] in regards to disposition needs. CM did state to [place] that if the plan will be for home pt will need 24 hours supervision. Pt may benefit from SNF. PT was able to work with patient for recommendations. CM did provide grandson in law the [facility] agency list and personal care provider list. [facility] paperwork in the room as well. CM will f/u in the am.   CM did speak to daughter in regards to [facility] following patient at home and she is agreeable. Referral sent to [facility] CM still awaiting phone call from daughter in regards to [place] choice. [name] Krauss, RN, BSN 1400.   [name] Roys, RN 03/03/2016, 12:21  PM

## 2016-03-03 NOTE — Consult Note (Signed)
Cardiology Consult    Patient ID: [name] MRN: [id number], DOB/AGE: 80-May-1927   Admit date: 03/02/2016 Date of Consult: 03/03/2016  Primary Physician: Ria Bush, MD Reason for Consult: Chest Pain Primary Cardiologist: Dr. Angelena Form Requesting Provider: Dr. Eliseo Squires   History of Present Illness    RICHRD DA is a 82 y.o. male with past medical history of CAD (s/p CABG in 1999), HTN, HLD, and dementia who presented to [facility] on 03/02/2016 for evaluation of chest pain.   The patient has baseline dementia and is only A&O x2 (person, place). Unsure of what year it is. No family present at the time of this encounter. History obtained by review of the chart.   He was recently seen by his PCP for worsening confusion and fatigue just last week. Noted worsening oral intake and decreased energy. Usually ambulates from his home to his granddaughters house who lives next door for exercise, but was too weak to make it back home. UA was performed and positive for UTI, therefore he was started on Cipro for 7 days.   Yesterday while at church, he developed a centralized chest discomfort with no associated dyspnea, diaphoresis, nausea, vomiting, or radiating pain. Was given SL NTG with some relief in his symptoms. Was without chest discomfort upon arrival to the ED.   At this time, he denies any chest discomfort. Is sitting in bed, watching the news. Says "I'm hungry". It appears he still lives by himself, with family living next door.   While admitted, labs have shown a WBC of 7.4, Hgb 12.8, and platelets 234. Creatinine 1.17. Lipid Panel with total cholesterol 213, HDL 26, and LDL 156. Cyclic troponin values have been negative. CXR shows newly seen left base density which could be left base PNA with a small effusion. EKG shows NSR, HR 70, with LAFB.    Past Medical History   Past Medical History:  Diagnosis Date  . Arthritis   . Carpal tunnel syndrome    new complaint 05/05/07 ov  : rec  - trial of bilat wrist splints.  . Coronary artery disease    s/p6 vessel CABG 1999 Dr Jobie Quaker  . Hx of migraines   . Hypercholesterolemia   . Hypertension   . Kidney stones remote  . Low back pain   . Osteoarthrosis, unspecified whether generalized or localized, unspecified site   . 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 Seasonal allergies   . Skin cancer    followed by Dr Denna Haggard    Past Surgical History:  Procedure Laterality Date  . CORONARY ARTERY BYPASS GRAFT     1999 DR Gearhardt     Allergies  Allergies  Allergen Reactions  . Hydrocodone Other (See Comments)    Possible reaction to hydrocodone vs oxycodone after leg surgery.  Altered mental status, aggressive.    . Tramadol Other (See Comments)    Possible source for altered mental status  . Nsaids Other (See Comments)    GI upset.   . Statins Other (See Comments)    Myalgia (thinks crestor)    Inpatient Medications    . ciprofloxacin  250 mg Oral BID  . famotidine (PEPCID) IV  20 mg Intravenous Q12H  . heparin  5,000 Units Subcutaneous Q8H    Family History  Family History  Problem Relation Age of Onset  . Heart failure Mother   . Arthritis Mother   . Alzheimer's disease Mother   . Diabetes Mother   . Heart failure Father   . Cancer Brother     lung (smoker)  . Parkinson's disease Brother     Social History    Social History   Social History  . Marital status: Widowed    Spouse name: N/A  . Number of children: 2  . Years of education: N/A   Occupational History  . retired    Social History Main Topics  . Smoking status: Never Smoker  . Smokeless tobacco: Never Used     Comment: pt stopped at age 76  . Alcohol use No  . Drug use: No  . Sexual activity: Not on file   Other Topics Concern  . 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     Review of Systems    Unable to obtain secondary to baseline dementia. Denies any current chest discomfort, dyspnea, palpitations, nausea, or vomiting.  Physical Exam    Blood pressure (!) 116/57, pulse 60, temperature 98.3 F (36.8 C), temperature source Oral, resp. rate 17, height 5\' 7"  (1.702 m), weight 156 lb 14.4 oz (71.2 kg), SpO2 93 %.  General: Pleasant, elderly Caucasian male appearing in NAD. Psych: Normal affect. Neuro: Alert and oriented X 2 (person, place). Moves all extremities spontaneously. HEENT: Normal  Neck: Supple without bruits or JVD. Lungs:  Resp regular and unlabored, CTA without wheezing or rales. Heart: RRR no s3, s4, or murmurs. Abdomen: Soft, non-tender, non-distended, BS + x 4.  Extremities: No clubbing, cyanosis or edema. DP/PT/Radials 2+ and equal bilaterally.  Labs    Troponin [facility] of Care Test)  Recent Labs  03/02/16 1233  TROPIPOC 0.01    Recent Labs  03/02/16 1554 03/02/16 1755 03/02/16 2011  CKTOTAL 50  --   --   TROPONINI <0.03 <0.03 <0.03   Lab Results  Component Value Date   WBC 6.0 03/03/2016   HGB 12.4 (L) 03/03/2016   HCT 37.8 (L) 03/03/2016   MCV 91.3 03/03/2016   PLT 239 03/03/2016    Recent Labs Lab 03/03/16 0321  NA 136  K 4.1  CL 100*  CO2 27  BUN 16  CREATININE 1.14  CALCIUM 9.0  PROT 6.0*  BILITOT 0.6  ALKPHOS 57  ALT 26  AST 27  GLUCOSE 81   Lab Results  Component Value Date   CHOL 213 (H) 03/02/2016   HDL 26 (L) 03/02/2016   LDLCALC 156 (H) 03/02/2016   TRIG 153 (H) 03/02/2016   No results found for: [facility]   Radiology Studies    Dg Chest 2 View  Result Date: 03/02/2016 CLINICAL DATA:  Mid chest pain intermittent over the last week. EXAM: CHEST  2 VIEW COMPARISON:  02/16/2016 FINDINGS: Previous median sternotomy and CABG. Mild cardiomegaly. Probable chronic obstructive lung  disease with scarring at the bases. At the left base, there appears to be some volume loss and a small effusion not seen previously. This could represent pneumonia. IMPRESSION: Previous CABG. Chronic lung disease. Newly seen density at the left base which could be left base pneumonia with a small effusion. Electronically Signed   By: Nelson Chimes M.D.   On: 03/02/2016 12:44   Dg Chest 2 View  Result Date:  02/16/2016 CLINICAL DATA:  Found in floor in living room, alert, but more confused than usual, incontinent of urine and stool. EMS called to get him up. Does not remember what happened. Denies any pain. EXAM: CHEST - 2 VIEW COMPARISON:  02/08/2008 FINDINGS: Mild bibasilar interstitial edema or infiltrates. Heart size upper limits normal.  Tortuous atheromatous aorta. No effusion.  No pneumothorax. Previous CABG. Degenerative changes in bilateral shoulders. Spondylitic changes in the thoracolumbar spine. IMPRESSION: 1. Mild bibasilar interstitial edema or infiltrates, new since previous. Electronically Signed   By: Lucrezia Europe M.D.   On: 02/16/2016 15:04   Dg Pelvis 1-2 Views  Result Date: 02/16/2016 CLINICAL DATA:  Found in floor in living room, alert, but more confused than usual, incontinent of urine and stool. EMS called to get him up. Does not remember what happened. Denies any pain. EXAM: PELVIS - 1-2 VIEW COMPARISON:  None. FINDINGS: There is no evidence of pelvic fracture or diastasis. No pelvic bone lesions are seen. Degenerative disc disease in the visualized lower lumbar spine. IMPRESSION: 1. Negative pelvis. 2. Lower lumbar degenerative disc disease. Electronically Signed   By: Lucrezia Europe M.D.   On: 02/16/2016 15:05   Ct Head Wo Contrast  Result Date: 02/16/2016 CLINICAL DATA:  Altered mental status with incontinence and confusion EXAM: CT HEAD WITHOUT CONTRAST TECHNIQUE: Contiguous axial images were obtained from the base of the skull through the vertex without intravenous contrast.  COMPARISON:  None. FINDINGS: Brain: There is moderate diffuse atrophy. There is no intracranial mass, hemorrhage, extra-axial fluid collection, or midline shift. There is patchy small vessel disease in the centra semiovale bilaterally. No acute infarct is evident. Vascular: The middle cerebral artery show symmetric attenuation bilaterally. No hyperdense vessel is evident. There are foci of calcification in each distal vertebral artery. There is also calcification in each carotid siphon. Skull: The bony calvarium appears intact. Sinuses/Orbits: There is mucosal thickening in several ethmoid air cells bilaterally. Frontal sinuses are nearly aplastic. Visualized paranasal sinuses elsewhere clear. Orbits appear symmetric bilaterally. Other: Mastoid air cells are clear. IMPRESSION: Moderate atrophy with patchy periventricular small vessel disease. No acute infarct evident. No hemorrhage or mass effect. There are areas of paranasal sinus disease. There are foci of arterial vascular calcification. Electronically Signed   By: Lowella Grip III M.D.   On: 02/16/2016 15:33    EKG & Cardiac Imaging    EKG: NSR, HR 70, with LAFB. No acute ST or T-wave changes.   Echocardiogram: Pending  Assessment & Plan    1. Chest Pain with known history of CAD - has known CAD, undergoing CABG in 1999 (operative notes not available in EPIC for review). - patient has baseline dementia and unable to recall events which brought him to the ER yesterday. In review of notes, he developed centralized chest discomfort with no associated dyspnea, diaphoresis, nausea, vomiting, or radiating pain while at church. Pain was relieved with SL NTG and he was without chest discomfort upon arrival to the ED.  - denies any current symptoms of chest discomfort, dyspnea, or palpitations this AM. Per nursing report, no complaints of chest discomfort overnight.  - cyclic troponin values have been negative and EKG shows NSR, HR 70, with LAFB.  -  with age, dementia, and resolved symptoms would favor conservative treatment. Will need to discuss with patient's family upon their arrival later today. Echocardiogram pending to assess LV function and wall motion. Would not anticipate ischemic evaluation at this time.   - continue ASA.  No BB secondary to bradycardia.   2. HLD - Lipid Panel shows total cholesterol 213, HDL 26, and LDL 156. - not currently on statin therapy secondary to age and dementia. Continue Fish Oil.   3. Baseline Dementia - A&O x 2. Still lives alone according to review of records with family living next door.  - history significantly limited as no family present during encounter this morning.   4. Abnormal CXR - CXR on admission showed  newly seen left base density which could be left base PNA with a small effusion. - WBC count normal. No fever, chills, or productive cough. - consider repeat CXR for reevaluation.  Signed, Erma Heritage, PA-C 03/03/2016, 7:45 AM Pager: [phone]

## 2016-03-03 NOTE — Progress Notes (Signed)
Unable to complete H&P. Pt has baseline dementia and is oriented to self only, No family present thus far my shift. Jessie Foot, RN

## 2016-03-04 DIAGNOSIS — R2689 Other abnormalities of gait and mobility: Secondary | ICD-10-CM | POA: Diagnosis not present

## 2016-03-04 DIAGNOSIS — J189 Pneumonia, unspecified organism: Secondary | ICD-10-CM | POA: Diagnosis not present

## 2016-03-04 DIAGNOSIS — I2510 Atherosclerotic heart disease of native coronary artery without angina pectoris: Secondary | ICD-10-CM | POA: Diagnosis not present

## 2016-03-04 DIAGNOSIS — E7800 Pure hypercholesterolemia, unspecified: Secondary | ICD-10-CM

## 2016-03-04 DIAGNOSIS — Z79899 Other long term (current) drug therapy: Secondary | ICD-10-CM | POA: Diagnosis not present

## 2016-03-04 DIAGNOSIS — R278 Other lack of coordination: Secondary | ICD-10-CM | POA: Diagnosis not present

## 2016-03-04 DIAGNOSIS — I25768 Atherosclerosis of bypass graft of coronary artery of transplanted heart with other forms of angina pectoris: Secondary | ICD-10-CM | POA: Diagnosis not present

## 2016-03-04 DIAGNOSIS — R2681 Unsteadiness on feet: Secondary | ICD-10-CM | POA: Diagnosis not present

## 2016-03-04 DIAGNOSIS — I10 Essential (primary) hypertension: Secondary | ICD-10-CM | POA: Diagnosis not present

## 2016-03-04 DIAGNOSIS — E440 Moderate protein-calorie malnutrition: Secondary | ICD-10-CM | POA: Diagnosis not present

## 2016-03-04 DIAGNOSIS — R748 Abnormal levels of other serum enzymes: Secondary | ICD-10-CM | POA: Diagnosis not present

## 2016-03-04 DIAGNOSIS — M19041 Primary osteoarthritis, right hand: Secondary | ICD-10-CM | POA: Diagnosis not present

## 2016-03-04 DIAGNOSIS — M6281 Muscle weakness (generalized): Secondary | ICD-10-CM | POA: Diagnosis not present

## 2016-03-04 DIAGNOSIS — Z951 Presence of aortocoronary bypass graft: Secondary | ICD-10-CM | POA: Diagnosis not present

## 2016-03-04 DIAGNOSIS — R5381 Other malaise: Secondary | ICD-10-CM | POA: Diagnosis not present

## 2016-03-04 DIAGNOSIS — G4700 Insomnia, unspecified: Secondary | ICD-10-CM | POA: Diagnosis not present

## 2016-03-04 DIAGNOSIS — N179 Acute kidney failure, unspecified: Secondary | ICD-10-CM | POA: Diagnosis not present

## 2016-03-04 DIAGNOSIS — R1312 Dysphagia, oropharyngeal phase: Secondary | ICD-10-CM | POA: Diagnosis not present

## 2016-03-04 DIAGNOSIS — R072 Precordial pain: Secondary | ICD-10-CM | POA: Diagnosis not present

## 2016-03-04 DIAGNOSIS — Z85828 Personal history of other malignant neoplasm of skin: Secondary | ICD-10-CM | POA: Diagnosis not present

## 2016-03-04 DIAGNOSIS — Z7982 Long term (current) use of aspirin: Secondary | ICD-10-CM | POA: Diagnosis not present

## 2016-03-04 DIAGNOSIS — I25708 Atherosclerosis of coronary artery bypass graft(s), unspecified, with other forms of angina pectoris: Secondary | ICD-10-CM | POA: Diagnosis not present

## 2016-03-04 DIAGNOSIS — R079 Chest pain, unspecified: Secondary | ICD-10-CM | POA: Diagnosis not present

## 2016-03-04 DIAGNOSIS — R509 Fever, unspecified: Secondary | ICD-10-CM | POA: Diagnosis not present

## 2016-03-04 DIAGNOSIS — R0789 Other chest pain: Secondary | ICD-10-CM | POA: Diagnosis not present

## 2016-03-04 DIAGNOSIS — D508 Other iron deficiency anemias: Secondary | ICD-10-CM | POA: Diagnosis not present

## 2016-03-04 DIAGNOSIS — E871 Hypo-osmolality and hyponatremia: Secondary | ICD-10-CM | POA: Diagnosis not present

## 2016-03-04 MED ORDER — ISOSORBIDE MONONITRATE ER 30 MG PO TB24: 15.0000 mg | ORAL_TABLET | Freq: Every day | ORAL | 0 refills | Status: DC

## 2016-03-04 MED ORDER — FAMOTIDINE 20 MG PO TABS: 20.0000 mg | ORAL_TABLET | Freq: Two times a day (BID) | ORAL | Status: AC

## 2016-03-04 MED ORDER — LEVOFLOXACIN 750 MG PO TABS: 750.0000 mg | ORAL_TABLET | ORAL | 0 refills | Status: DC

## 2016-03-04 NOTE — Progress Notes (Signed)
SUBJECTIVE:  No complaints  OBJECTIVE:   Vitals:   Vitals:   03/02/16 2120 03/03/16 0532 03/03/16 1359 03/04/16 0525  BP: (!) 142/71 (!) 116/57 (!) 94/54 (!) 138/58  Pulse: 64 60 69 77  Resp: 18 17 17 16   Temp: 98.1 F (36.7 C) 98.3 F (36.8 C) 98.7 F (37.1 C) 98 F (36.7 C)  TempSrc: Oral Oral Oral Oral  SpO2: 99% 93% 94% 95%  Weight:  156 lb 14.4 oz (71.2 kg)  152 lb 1.6 oz (69 kg)  Height:       I&O's:   Intake/Output Summary (Last 24 hours) at 03/04/16 1012 Last data filed at 03/04/16 0246  Gross per 24 hour  Intake              360 ml  Output              725 ml  Net             -365 ml   TELEMETRY: Reviewed telemetry pt in NSR:     PHYSICAL EXAM General: Well developed, well nourished, in no acute distress Head: Eyes PERRLA, No xanthomas.   Normal cephalic and atramatic  Lungs:   Clear bilaterally to auscultation and percussion. Heart:   HRRR S1 S2 Pulses are 2+ & equal. Abdomen: Bowel sounds are positive, abdomen soft and non-tender without masses  Msk:  Back normal, normal gait. Normal strength and tone for age. Extremities:   No clubbing, cyanosis or edema.  DP +1 Neuro: Alert and oriented X 3. Psych:  Good affect, responds appropriately   LABS: Basic Metabolic Panel:  Recent Labs  03/02/16 1214 03/03/16 0321  NA 135 136  K 3.9 4.1  CL 103 100*  CO2 23 27  GLUCOSE 107* 81  BUN 17 16  CREATININE 1.17 1.14  CALCIUM 9.2 9.0   Liver Function Tests:  Recent Labs  03/03/16 0321  AST 27  ALT 26  ALKPHOS 57  BILITOT 0.6  PROT 6.0*  ALBUMIN 2.9*   No results for input(s): LIPASE, AMYLASE in the last 72 hours. CBC:  Recent Labs  03/02/16 1214 03/03/16 0321  WBC 7.4 6.0  NEUTROABS 5.4  --   HGB 12.8* 12.4*  HCT 39.4 37.8*  MCV 92.1 91.3  PLT 234 239   Cardiac Enzymes:  Recent Labs  03/02/16 1554 03/02/16 1755 03/02/16 2011  CKTOTAL 50  --   --   TROPONINI <0.03 <0.03 <0.03   BNP: Invalid input(s):  POCBNP D-Dimer: No results for input(s): DDIMER in the last 72 hours. Hemoglobin A1C: No results for input(s): HGBA1C in the last 72 hours. Fasting Lipid Panel:  Recent Labs  03/02/16 1548  CHOL 213*  HDL 26*  LDLCALC 156*  TRIG 153*  CHOLHDL 8.2   Thyroid Function Tests: No results for input(s): TSH, T4TOTAL, T3FREE, THYROIDAB in the last 72 hours.  Invalid input(s): FREET3 Anemia Panel: No results for input(s): VITAMINB12, FOLATE, FERRITIN, TIBC, IRON, RETICCTPCT in the last 72 hours. Coag Panel:   Lab Results  Component Value Date   INR 1.0 02/08/2008    RADIOLOGY: Dg Chest 2 View  Result Date: 03/02/2016 CLINICAL DATA:  Mid chest pain intermittent over the last week. EXAM: CHEST  2 VIEW COMPARISON:  02/16/2016 FINDINGS: Previous median sternotomy and CABG. Mild cardiomegaly. Probable chronic obstructive lung disease with scarring at the bases. At the left base, there appears to be some volume loss and a small effusion  not seen previously. This could represent pneumonia. IMPRESSION: Previous CABG. Chronic lung disease. Newly seen density at the left base which could be left base pneumonia with a small effusion. Electronically Signed   By: Nelson Chimes M.D.   On: 03/02/2016 12:44   Dg Chest 2 View  Result Date: 02/16/2016 CLINICAL DATA:  Found in floor in living room, alert, but more confused than usual, incontinent of urine and stool. EMS called to get [name] up. Does not remember what happened. Denies any pain. EXAM: CHEST - 2 VIEW COMPARISON:  02/08/2008 FINDINGS: Mild bibasilar interstitial edema or infiltrates. Heart size upper limits normal.  Tortuous atheromatous aorta. No effusion.  No pneumothorax. Previous CABG. Degenerative changes in bilateral shoulders. Spondylitic changes in the thoracolumbar spine. IMPRESSION: 1. Mild bibasilar interstitial edema or infiltrates, new since previous. Electronically Signed   By: Lucrezia Europe M.D.   On: 02/16/2016 15:04   Dg Pelvis 1-2  Views  Result Date: 02/16/2016 CLINICAL DATA:  Found in floor in living room, alert, but more confused than usual, incontinent of urine and stool. EMS called to get [name] up. Does not remember what happened. Denies any pain. EXAM: PELVIS - 1-2 VIEW COMPARISON:  None. FINDINGS: There is no evidence of pelvic fracture or diastasis. No pelvic bone lesions are seen. Degenerative disc disease in the visualized lower lumbar spine. IMPRESSION: 1. Negative pelvis. 2. Lower lumbar degenerative disc disease. Electronically Signed   By: Lucrezia Europe M.D.   On: 02/16/2016 15:05   Ct Head Wo Contrast  Result Date: 02/16/2016 CLINICAL DATA:  Altered mental status with incontinence and confusion EXAM: CT HEAD WITHOUT CONTRAST TECHNIQUE: Contiguous axial images were obtained from the base of the skull through the vertex without intravenous contrast. COMPARISON:  None. FINDINGS: Brain: There is moderate diffuse atrophy. There is no intracranial mass, hemorrhage, extra-axial fluid collection, or midline shift. There is patchy small vessel disease in the centra semiovale bilaterally. No acute infarct is evident. Vascular: The middle cerebral artery show symmetric attenuation bilaterally. No hyperdense vessel is evident. There are foci of calcification in each distal vertebral artery. There is also calcification in each carotid siphon. Skull: The bony calvarium appears intact. Sinuses/Orbits: There is mucosal thickening in several ethmoid air cells bilaterally. Frontal sinuses are nearly aplastic. Visualized paranasal sinuses elsewhere clear. Orbits appear symmetric bilaterally. Other: Mastoid air cells are clear. IMPRESSION: Moderate atrophy with patchy periventricular small vessel disease. No acute infarct evident. No hemorrhage or mass effect. There are areas of paranasal sinus disease. There are foci of arterial vascular calcification. Electronically Signed   By: Lowella Grip III M.D.   On: 02/16/2016 15:33    Assessment  & Plan    1. Chest Pain with known history of CAD - has known CAD, undergoing CABG in 1999 (operative notes not available in EPIC for review). - patient has baseline dementia and unable to recall events which brought [name] to the ER . In review of notes, he developed centralized chest discomfort with no associated dyspnea, diaphoresis, nausea, vomiting, or radiating pain while at church. Pain was relieved with SL NTG and he was without chest discomfort upon arrival to the ED.  - denies any current symptoms of chest discomfort, dyspnea, or palpitations this AM. Per nursing report, no complaints of chest discomfort overnight.  - cyclic troponin values have been negative and EKG shows NSR, HR 70, with LAFB.  - with age, dementia, resolved symptoms and normal LVF on echo would favor conservative treatment. No  further ischemic workup at this time. - continue ASA and long acting nitrate. No BB secondary to bradycardia.   2. HLD - Lipid Panel shows total cholesterol 213, HDL 26, and LDL 156. - not currently on statin therapy secondary to age and dementia. Continue Fish Oil.   3. Baseline Dementia - A&O x 2. Still lives alone according to review of records with family living next door.  - history significantly limited as no family present during encounter this morning.   4. Abnormal CXR - CXR on admission showed  newly seen left base density which could be left base PNA with a small effusion. - WBC count normal. No fever, chills, or productive cough. - consider repeat CXR for reevaluation.  No new recs from cardiac standpoint.  Will sign off.  Call with any questions.  Have patient followup in our office in 1-2 weeks with extender or Dr. Angelena Form.   [name] Him, MD  03/04/2016  10:12 AM

## 2016-03-04 NOTE — NC FL2 (Signed)
[place] LEVEL OF CARE SCREENING TOOL     IDENTIFICATION  Patient Name: [name] Birthdate: Feb 27, 1926 Sex: male Admission Date (Current Location): 03/02/2016  [facility] and [place] Number:  Herbalist and Address:  [facility], [place] [address] [place], [place] [address]      Provider Number: [id number]  Attending Physician Name and Address:  Geradine Girt, DO  Relative Name and Phone Number:  [place], Gotz  [phone]    Current Level of Care: Hospital Recommended Level of Care: [place] Prior Approval Number:    Date Approved/Denied:   PASRR Number: [id number]  Discharge Plan: SNF    Current Diagnoses: Patient Active Problem List   Diagnosis Date Noted  . Chest pain 03/02/2016  . UTI (urinary tract infection) 03/02/2016  . Anemia of chronic disease 03/02/2016  . AKI (acute kidney injury) ([place]) 03/02/2016  . Elevated CK 02/20/2016  . Polyarthralgia 02/12/2016  . Primary osteoarthritis of both hands 10/03/2014  . CARPAL TUNNEL SYNDROME, MILD 05/05/2007  . BYSSINOSIS 05/05/2007  . Cancer of skin of right lower leg 05/05/2007  . Pure hypercholesterolemia 03/05/2007  . Essential hypertension 03/05/2007  . CAD (coronary artery disease) of artery bypass graft 03/05/2007  . DEGENERATIVE JOINT DISEASE 03/05/2007  . LOW BACK PAIN, CHRONIC 03/05/2007  . CORONARY ARTERY BYPASS GRAFT, HX OF 03/05/2007    Orientation RESPIRATION BLADDER Height & Weight     Self, Place  Normal Incontinent Weight: 152 lb 1.6 oz (69 kg) Height:  5\' 7"  (170.2 cm)  BEHAVIORAL SYMPTOMS/MOOD NEUROLOGICAL BOWEL NUTRITION STATUS   (none)  (None) Continent  (Heart healthy)  AMBULATORY STATUS COMMUNICATION OF NEEDS Skin   Limited Assist Verbally Normal                       Personal Care Assistance Level of Assistance  Bathing, Feeding, Dressing Bathing Assistance: Limited assistance Feeding assistance:  Independent Dressing Assistance: Limited assistance     Functional Limitations Info  Sight, Hearing, Speech Sight Info: Adequate Hearing Info: Adequate Speech Info: Adequate    SPECIAL CARE FACTORS FREQUENCY                       Contractures Contractures Info: Not present    Additional Factors Info  Allergies, Code Status Code Status Info: DNR Allergies Info:  Hydrocodone, Tramadol, Nsaids, Statins           Current Medications (03/04/2016):  This is the current hospital active medication list Current Facility-Administered Medications  Medication Dose Route Frequency Provider Last Rate Last Dose  . acetaminophen (TYLENOL) tablet 650 mg  650 mg Oral Q4H PRN Rondel Jumbo, PA-C      . aspirin EC tablet 81 mg  81 mg Oral Daily Fransisco Hertz [place], [place]   81 mg at 03/03/16 0820  . famotidine (PEPCID) tablet 20 mg  20 mg Oral BID Erma Heritage, [place]   20 mg at 03/03/16 [id number]  . gi cocktail (Maalox,Lidocaine,Donnatal)  30 mL Oral QID PRN Rondel Jumbo, PA-C      . heparin injection 5,000 Units  5,000 Units Subcutaneous Q8H Rondel Jumbo, PA-C   5,000 Units at 03/02/16 2136  . isosorbide mononitrate (IMDUR) 24 hr tablet 15 mg  15 mg Oral Daily Sueanne Margarita, MD   15 mg at 03/03/16 1000  . levofloxacin (LEVAQUIN) tablet 750 mg  750 mg Oral Q48H Kimberly B  Hammons, RPH   750 mg at 03/03/16 1200  . ondansetron (ZOFRAN) injection 4 mg  4 mg Intravenous Q6H PRN Rondel Jumbo, PA-C         Discharge Medications: Please see discharge summary for a list of discharge medications.  Relevant Imaging Results:  Relevant Lab Results:   Additional Information SSN:[id number]  [name], LCSW

## 2016-03-04 NOTE — Progress Notes (Signed)
Patient discharging to [facility] place, attempted to call report transport has been arranged for patient, receptionist I spoke with was unable to locate who would be receiving him. Patient ready for discharge with packet from social work on chart.

## 2016-03-04 NOTE — Discharge Summary (Signed)
Physician Discharge Summary  [name] [id number] DOB: June 17, 1925 DOA: 03/02/2016  PCP: Ria Bush, MD  Admit date: 03/02/2016 Discharge date: 03/04/2016   Recommendations for Outpatient Follow-Up:   1. Would recommend palliative care follow up 2. DNR 3. levaquin for 2 more doses   Discharge Diagnosis:   Active Problems:   Pure hypercholesterolemia   Essential hypertension   CAD (coronary artery disease) of artery bypass graft   CORONARY ARTERY BYPASS GRAFT, HX OF   Chest pain   Anemia of chronic disease   AKI (acute kidney injury) [facility])   Discharge disposition:   SNF:  Discharge Condition: Improved.  Diet recommendation: Low sodium, heart healthy.  Wound care: None.   History of Present Illness:   [name] is a 82 y.o. male with a Past Medical History of hypertension, hyperlipidemia, coronary disease, dementia, recent urinary tract infection on ciprofloxacin presented bilateral shoulder pains and central substernal chest pain since 10:00 at church.  Upon arrival, patient denies any chest pain currently. Family states he does have pretty severe dementia, his mentation has been worsening, and are not certain what meds he takes.  They are certain he did take antibiotics for his UTI is a been monitoring that.   Hospital Course by Problem:   1. Chest Pain with known history of CAD - has known CAD, undergoing CABG in 1999 (operative notes not available in EPIC for review). - patient has baseline dementia and unable to recall events which brought him to the ER . In review of notes, he developed centralized chest discomfort with no associated dyspnea, diaphoresis, nausea, vomiting, or radiating pain while at church. Pain was relieved with SL NTG and he was without chest discomfort upon arrival to the ED.  - denies any current symptoms of chest discomfort, dyspnea, or palpitations this AM. Per nursing report, no complaints of chest discomfort overnight.  -  cyclic troponin values have been negative and EKG shows NSR, HR 70, with LAFB.  - with age, dementia, resolved symptoms and normal LVF on echo would favor conservative treatment. No further ischemic workup at this time. - continue ASA and long acting nitrate. No BB secondary to bradycardia.   2. HLD - Lipid Panel shows total cholesterol 213, HDL 26, and LDL 156. - not currently on statin therapy secondary to age and dementia. Continue Fish Oil.   3.  Dementia - A&O x 2. Still lives alone according to review of records with family living next door.  - would have palliative care follow patient and work with family  4. Abnormal CXR- suspect PNA--  - treat with 5 days of levaquin  Patient lives home alone and family not able to live and care for him.  He is still driving-- instructed not to drive and patient to go to SNF and family to consider memory care facility  Medical Consultants:   cards  Discharge Exam:   Vitals:   03/03/16 1359 03/04/16 0525  BP: (!) 94/54 (!) 138/58  Pulse: 69 77  Resp: 17 16  Temp: 98.7 F (37.1 C) 98 F (36.7 C)   Vitals:   03/02/16 2120 03/03/16 0532 03/03/16 1359 03/04/16 0525  BP: (!) 142/71 (!) 116/57 (!) 94/54 (!) 138/58  Pulse: 64 60 69 77  Resp: 18 17 17 16   Temp: 98.1 F (36.7 C) 98.3 F (36.8 C) 98.7 F (37.1 C) 98 F (36.7 C)  TempSrc: Oral Oral Oral Oral  SpO2: 99% 93% 94% 95%  Weight:  71.2 kg (156 lb 14.4 oz)  69 kg (152 lb 1.6 oz)  Height:        Gen:  NAD-very hard of hearing and pleasantly demented   The results of significant diagnostics from this hospitalization (including imaging, microbiology, ancillary and laboratory) are listed below for reference.     Procedures and Diagnostic Studies:   Dg Chest 2 View  Result Date: 03/02/2016 CLINICAL DATA:  Mid chest pain intermittent over the last week. EXAM: CHEST  2 VIEW COMPARISON:  02/16/2016 FINDINGS: Previous median sternotomy and CABG. Mild cardiomegaly. Probable  chronic obstructive lung disease with scarring at the bases. At the left base, there appears to be some volume loss and a small effusion not seen previously. This could represent pneumonia. IMPRESSION: Previous CABG. Chronic lung disease. Newly seen density at the left base which could be left base pneumonia with a small effusion. Electronically Signed   By: Nelson Chimes M.D.   On: 03/02/2016 12:44     Labs:   Basic Metabolic Panel:  Recent Labs Lab 03/02/16 1214 03/03/16 0321  NA 135 136  K 3.9 4.1  CL 103 100*  CO2 23 27  GLUCOSE 107* 81  BUN 17 16  CREATININE 1.17 1.14  CALCIUM 9.2 9.0   GFR Estimated Creatinine Clearance: 41.9 mL/min (by C-G formula based on SCr of 1.14 mg/dL). Liver Function Tests:  Recent Labs Lab 03/03/16 0321  AST 27  ALT 26  ALKPHOS 57  BILITOT 0.6  PROT 6.0*  ALBUMIN 2.9*   No results for input(s): LIPASE, AMYLASE in the last 168 hours. No results for input(s): AMMONIA in the last 168 hours. Coagulation profile No results for input(s): INR, PROTIME in the last 168 hours.  CBC:  Recent Labs Lab 03/02/16 1214 03/03/16 0321  WBC 7.4 6.0  NEUTROABS 5.4  --   HGB 12.8* 12.4*  HCT 39.4 37.8*  MCV 92.1 91.3  PLT 234 239   Cardiac Enzymes:  Recent Labs Lab 03/02/16 1554 03/02/16 1755 03/02/16 2011  CKTOTAL 50  --   --   TROPONINI <0.03 <0.03 <0.03   BNP: Invalid input(s): POCBNP CBG: No results for input(s): GLUCAP in the last 168 hours. D-Dimer No results for input(s): DDIMER in the last 72 hours. Hgb A1c No results for input(s): HGBA1C in the last 72 hours. Lipid Profile  Recent Labs  03/02/16 1548  CHOL 213*  HDL 26*  LDLCALC 156*  TRIG 153*  CHOLHDL 8.2   Thyroid function studies No results for input(s): TSH, T4TOTAL, T3FREE, THYROIDAB in the last 72 hours.  Invalid input(s): FREET3 Anemia work up No results for input(s): VITAMINB12, FOLATE, FERRITIN, TIBC, IRON, RETICCTPCT in the last 72  hours. Microbiology Recent Results (from the past 240 hour(s))  Urine culture     Status: None (Preliminary result)   Collection Time: 02/27/16  4:00 PM  Result Value Ref Range Status   Colony Count Greater than 100,000 CFU/mL  Preliminary   Preliminary Report STAPHYLOCOCCUS SPECIES  Preliminary  Urine culture     Status: None   Collection Time: 03/02/16  1:20 PM  Result Value Ref Range Status   Specimen Description URINE, CLEAN CATCH  Final   Special Requests NONE  Final   Culture NO GROWTH  Final   Report Status 03/03/2016 FINAL  Final     Discharge Instructions:   Discharge Instructions    Diet - low sodium heart healthy    Complete by:  As directed  Discharge instructions    Complete by:  As directed    DNR   Increase activity slowly    Complete by:  As directed        Medication List    STOP taking these medications   ciprofloxacin 250 MG tablet Commonly known as:  CIPRO   OVER THE COUNTER MEDICATION     TAKE these medications   acetaminophen 500 MG tablet Commonly known as:  TYLENOL Take 2 tablets (1,000 mg total) by mouth every 6 (six) hours as needed (pain). What changed:  how much to take  reasons to take this   aspirin EC 81 MG tablet Take 81 mg by mouth daily.   CALCIUM-MAGNESIUM-VITAMIN D PO Take 1 tablet by mouth daily.   CoQ10 400 MG Caps Take 400 mg by mouth daily.   famotidine 20 MG tablet Commonly known as:  PEPCID Take 1 tablet (20 mg total) by mouth 2 (two) times daily.   Fish Oil 1000 MG Caps Take 1,000 mg by mouth 2 (two) times daily.   isosorbide mononitrate 30 MG 24 hr tablet Commonly known as:  IMDUR Take 0.5 tablets (15 mg total) by mouth daily. Start taking on:  03/05/2016   levofloxacin 750 MG tablet Commonly known as:  LEVAQUIN Take 1 tablet (750 mg total) by mouth every other day. Start taking on:  03/05/2016   multivitamin with minerals Tabs tablet Take 1 tablet by mouth daily.   MUSCLE RUB 10-15 %  Crea Apply 1 application topically every Saturday. Apply to hands and shoulders      Follow-up Information    Ria Bush, MD Follow up in 1 week(s).   Specialty:  Family Medicine Contact information: [place] [address] [phone]            Time coordinating discharge: 35 min  Signed:  Naiyana Barbian Alison Stalling   [facility] 03/04/2016, 11:24 AM

## 2016-03-04 NOTE — Consult Note (Signed)
   [facility] CM Inpatient Consult   03/04/2016  [name] Dec 27, 1925 [id number]   [facility] Care Management follow up. Spoke with inpatient RNCM who indicates the discharge plan is now SNF. [facility] Care Management packet and contact information was left in [name] room on yesterday for family to contact if needed. Since discharge plan is now for SNF, [facility] Care Management not needed at this time. Discussed this with inpatient RNCM.   Marthenia Rolling, MSN-Ed, RN,BSN [facility] Liaison [phone]

## 2016-03-04 NOTE — Progress Notes (Signed)
Physical Therapy Treatment Patient Details Name: [name] MRN: [id number] DOB: [birth year] Today's Date: 03/04/2016    History of Present Illness 82 y.o. male admitted to [facility] on 03/02/16 with chest pain.  Dx with possible PNA, acute kidney injury, and dementia that seems to have gotten progressively worse per family at PT eval.  Pt with significant PMHx of OA, low back pain, HTN, CAD, and CABG.      PT Comments    Pt was able to get up and ambulate with RW and mod assist.  I attempted without RW (as this is a novel task for him and can be difficult due to his dementia), but it was worse without RW.  Daughter in room during session and speaking with SW re: SNF placement.  Pt not even able to recognize or tell me his daughter's name.  PT will continue to follow acutely, frequency updated to reflect d/c destination, however, pt would benefit from more times per week if able.    Follow Up Recommendations  SNF     Equipment Recommendations  None recommended by PT    Recommendations for Other Services   NA     Precautions / Restrictions Precautions Precautions: Fall Precaution Comments: h/o recent fall Restrictions Weight Bearing Restrictions: No    Mobility  Bed Mobility Overal bed mobility: Needs Assistance Bed Mobility: Supine to Sit Rolling: Min assist   Supine to sit: Mod assist     General bed mobility comments: Mod assist to support trunk to get to sitting EOB.  Pt with posterior preference throughout.   Transfers Overall transfer level: Needs assistance Equipment used: Rolling walker (2 wheeled) Transfers: Sit to/from Stand Sit to Stand: Mod assist         General transfer comment: Mod assist to help pt power up and to anteriorly weight shift in standing.  He needed a significant amount of assist to stabilize in standing and use the urinal.    Ambulation/Gait Ambulation/Gait assistance: Mod assist Ambulation Distance (Feet): 120 Feet Assistive device:  Rolling walker (2 wheeled) Gait Pattern/deviations: Step-through pattern;Shuffle;Trunk flexed Gait velocity: decreased   General Gait Details: Pt pushing RW too far ahead and keeping hands on the back of then handle bars.  I attempted gait without the RW, but pt reaching heavily for objects with both hands for stability in room, so switched back to walking with RW.           Balance Overall balance assessment: Needs assistance Sitting-balance support: Feet supported;Bilateral upper extremity supported;No upper extremity supported Sitting balance-Leahy Scale: Poor Sitting balance - Comments: LOB posteriorly while attempting to manipulate the urinal in sitting.  Postural control: Right lateral lean;Posterior lean Standing balance support: Bilateral upper extremity supported Standing balance-Leahy Scale: Poor                      Cognition Arousal/Alertness: Awake/alert Behavior During Therapy: Flat affect Overall Cognitive Status: History of cognitive impairments - at baseline (worse here in the hospital)                             Pertinent Vitals/Pain Pain Assessment: No/denies pain Faces Pain Scale: No hurt    Home Living Family/patient expects to be discharged to:: Skilled nursing facility Living Arrangements: Alone Available Help at Discharge: Family;Available PRN/intermittently Type of Home: House Home Access: Ramped entrance   Home Layout: One level;Laundry or work area in [place]: Environmental consultant -  2 wheels;Cane - single point;Wheelchair - manual      Prior Function Level of Independence: Independent      Comments: Pt drives to church and to [facility], family checks in regularly.  Family provides food/meals, pt heats up prepared meals.  Pt still mows his yard.  He is functional in his home environment and per grandson could not tell you the year, day, date, or president at baseline.     PT Goals (current goals can now be found in the care  plan section) Acute Rehab PT Goals Patient Stated Goal: unable to state due to dementia Progress towards PT goals: Progressing toward goals    Frequency    Min 2X/week (would benefit from more)      PT Plan Frequency needs to be updated       End of Session Equipment Utilized During Treatment: Gait belt Activity Tolerance: Patient limited by fatigue Patient left: in chair;with call bell/phone within reach;with chair alarm set;with family/visitor present     Time: [id number] PT Time Calculation (min) (ACUTE ONLY): 24 min  Charges:  $Gait Training: 8-22 mins $Therapeutic Activity: 8-22 mins                      Anise Harbin B. Adren Dollins, PT, DPT [phone]   03/04/2016, 12:44 PM

## 2016-03-04 NOTE — Progress Notes (Signed)
Pt is confused(history of dementia), only oriented to self(baseline). He keeps pulling telemetry leads off and is becoming increasingly agitated when we re-apply telemetry. Pt now refusing to allow tele box to be put back on. Provider on call notified of events/Telemetry off and will continue to monitor. Jessie Foot, RN

## 2016-03-04 NOTE — Evaluation (Signed)
Occupational Therapy Evaluation Patient Details Name: [name] MRN: [id number] DOB: 1926/04/19 Today's Date: 03/04/2016    History of Present Illness 82 y.o. male admitted to [facility] on 03/02/16 with chest pain.  Dx with possible PNA, acute kidney injury, and dementia that seems to have gotten progressively worse per family at PT eval.  Pt with significant PMHx of OA, low back pain, HTN, CAD, and CABG.     Clinical Impression   This 82 yo male admitted with above presents to acute OT with deficits below (see OT problem list) thus affecting his ability to care for himself as he was PTA. He will benefit from acute OT with follow up OT at SNF to work on increasing independence and decrease burden of care.    Follow Up Recommendations  SNF    Equipment Recommendations  Other (comment) (TBD at next venue)       Precautions / Restrictions Precautions Precautions: Fall Precaution Comments: h/o recent fall Restrictions Weight Bearing Restrictions: No      Mobility Bed Mobility Overal bed mobility: Needs Assistance Bed Mobility: Rolling;Supine to Sit Rolling: Min assist   Supine to sit: Min assist     General bed mobility comments: verbal and gestural cues and increased time  Transfers                 General transfer comment: attempted to stand with pt and he did come up with Mod A but then would not turn his feet to get to recliner--layed back down    Balance Overall balance assessment: Needs assistance Sitting-balance support: No upper extremity supported;Feet supported Sitting balance-Leahy Scale: Poor   Postural control: Right lateral lean Standing balance support: Bilateral upper extremity supported Standing balance-Leahy Scale: Poor                              ADL Overall ADL's : Needs assistance/impaired Eating/Feeding: Total assistance;Bed level Eating/Feeding Details (indicate cue type and reason): Pt's breakfast sitting in front of him,  but the only thing it looked like he had attempted to eat was the muffin (and 1/2 of it was in the bed with him) Grooming: Wash/dry face;Wash/dry hands;Bed level Grooming Details (indicate cue type and reason): would not do this on his own once washcloth handed to him and resisted hand over hand for these as well Upper Body Bathing: Total assistance;Bed level   Lower Body Bathing: Total assistance;Bed level   Upper Body Dressing : Total assistance;Bed level   Lower Body Dressing: Total assistance;Bed level                                 Pertinent Vitals/Pain Pain Assessment: Faces Faces Pain Scale: No hurt     Hand Dominance Right   Extremity/Trunk Assessment Upper Extremity Assessment Upper Extremity Assessment: Generalized weakness (resists movements at times (ie: me trying hand over hand to help him wash face; however he naturally reached up and shook my hand and put his glasses on once I handed them to him)           Communication Communication Communication: HOH   Cognition Arousal/Alertness: Awake/alert Behavior During Therapy: Flat affect Overall Cognitive Status:  (H/o of cognitive impairments--worse then normal)  Home Living Family/patient expects to be discharged to:: Skilled nursing facility Living Arrangements: Alone Available Help at Discharge: Family;Available PRN/intermittently Type of Home: House Home Access: Ramped entrance     Home Layout: One level;Laundry or work area in [place]: Environmental consultant - 2 wheels;Cane - single point;Wheelchair - manual          Prior Functioning/Environment Level of Independence: Independent        Comments: Pt drives to church and to [facility], family checks in regularly.  Family provides food/meals, pt heats up prepared meals.  Pt still mows his yard.  He is functional in his home environment and per grandson could not tell you the  year, day, date, or president at baseline.          OT Problem List: Impaired balance (sitting and/or standing);Decreased activity tolerance;Decreased safety awareness;Decreased knowledge of use of DME or AE;Decreased cognition   OT Treatment/Interventions: Self-care/ADL training;Therapeutic activities;Patient/family education;DME and/or AE instruction;Balance training    OT Goals(Current goals can be found in the care plan section) Acute Rehab OT Goals Patient Stated Goal: unable to state due to dementia OT Goal Formulation: Patient unable to participate in goal setting Time For Goal Achievement: 03/18/16 Potential to Achieve Goals: Fair  OT Frequency: Min 1X/week   Barriers to D/C: Decreased caregiver support             End of Session Equipment Utilized During Treatment: Gait belt Nurse Communication:  (NT: pt completely wet)  Activity Tolerance: Patient limited by fatigue;Other (comment) (dementia, unfamilar environment and people) Patient left: in bed;with call bell/phone within reach;with bed alarm set   Time: 1038-1050 OT Time Calculation (min): 12 min Charges:  OT General Charges $OT Visit: 1 Procedure OT Evaluation $OT Eval Moderate Complexity: 1 Procedure G-Codes: OT G-codes **NOT FOR INPATIENT CLASS** Functional Assessment Tool Used: Clincal observation Functional Limitation: Self care Self Care Current Status [id number]): At least 80 percent but less than 100 percent impaired, limited or restricted Self Care Goal Status [id number]): At least 60 percent but less than 80 percent impaired, limited or restricted  Almon Register [id number] 03/04/2016, 11:01 AM

## 2016-03-04 NOTE — Clinical Social Work Placement (Signed)
   CLINICAL SOCIAL WORK PLACEMENT  NOTE  Date:  03/04/2016  Patient Details  Name: [name] MRN: [id number] Date of Birth: [birth year]  Clinical Social Work is seeking post-discharge placement for this patient at the [place] level of care (*CSW will initial, date and re-position this form in  chart as items are completed):  Yes   Patient/family provided with [place] Work Department's list of facilities offering this level of care within the geographic area requested by the patient (or if unable, by the patient's family).  Yes   Patient/family informed of their freedom to choose among providers that offer the needed level of care, that participate in Medicare, Medicaid or managed care program needed by the patient, have an available bed and are willing to accept the patient.  Yes   Patient/family informed of [place]'s ownership interest in [facility] and [facility], as well as of the fact that they are under no obligation to receive care at these facilities.  PASRR submitted to EDS on 03/04/16     PASRR number received on 03/04/16     Existing PASRR number confirmed on       FL2 transmitted to all facilities in geographic area requested by pt/family on 03/04/16     FL2 transmitted to all facilities within larger geographic area on       Patient informed that his/her managed care company has contracts with or will negotiate with certain facilities, including the following:        Yes   Patient/family informed of bed offers received.  Patient chooses bed at [facility]     Physician recommends and patient chooses bed at      Patient to be transferred to [facility] on 03/04/16.  Patient to be transferred to facility by Ambulance     Patient family notified on 03/04/16 of transfer.  Name of family member notified:  [name],[name] and Lead Please sign DNR, Please sign FL2, Please prepare priority  discharge summary, including medications, Please prepare prescriptions     Additional Comment:  Per MD patient is ready to discharge to [facility]. RN, patient, patient's family, and facility notified of discharge. RN given phone number for report and transport packet is on patient's chart. Ambulance transport requested. CSW signing off.   _______________________________________________ [name], LCSW 03/04/2016, 3:52 PM

## 2016-03-04 NOTE — Clinical Social Work Note (Signed)
Clinical Social Work Assessment  Patient Details  Name: [name] MRN: [id number] Date of Birth: 01-Feb-1926  Date of referral:  03/04/16               Reason for consult:  Discharge Planning                Permission sought to share information with:  Facility Sport and exercise psychologist, Family Supports Permission granted to share information::     Name::        Agency::  [facility]NF[facility]  Relationship::  Delaney Meigs Information:     Housing/Transportation Living arrangements for the past 2 months:  [place] of Information:  Adult Children Patient Interpreter Needed:  None Criminal Activity/Legal Involvement Pertinent to Current Situation/Hospitalization:  No - Comment as needed Significant Relationships:  Adult Children Lives with:  Self Do you feel safe going back to the place where you live?  Yes Need for family participation in patient care:  Yes (Comment)  Care giving concerns:   Patient is oriented to self only. CSW called patient's daughter. She reported she would like patient to have short term rehab to rebuild his strength to return home.   Social Worker assessment / plan:  CSW called patient's daughter CSW explained PT recommendation for SNF placement. CSW explained SNF search and placement process to the patient's daughter and answered her questions. CSW will follow up with bed offers.  Employment status:  Retired Forensic scientist:    PT Recommendations:  [place] / Referral to community resources:  [place]  Patient/Family's Response to care:  The patient's daughter is happy with the care the patient has received.   Patient/Family's Understanding of and Emotional Response to Diagnosis, Current Treatment, and Prognosis:  The patient's daughter has a good understanding of why the patient  was admitted. She understands the care plan and what the patient will need post discharge.  Emotional  Assessment Appearance:   (Unable to assess.) Attitude/Demeanor/Rapport:  Unable to Assess Affect (typically observed):  Unable to Assess Orientation:  Fluctuating Orientation (Suspected and/or reported Sundowners) Alcohol / Substance use:  Not Applicable Psych involvement (Current and /or in the community):  No (Comment)  Discharge Needs  Concerns to be addressed:  Discharge Planning Concerns Readmission within the last 30 days:  No Current discharge risk:  None Barriers to Discharge:  No Barriers Identified   Sheridan Hew A Henrique Parekh, LCSW 03/04/2016, 10:28 AM

## 2016-03-04 NOTE — Clinical Social Work Placement (Signed)
   CLINICAL SOCIAL WORK PLACEMENT  NOTE  Date:  03/04/2016  Patient Details  Name: [name] MRN: [id number] Date of Birth: 1926-04-08  Clinical Social Work is seeking post-discharge placement for this patient at the [place] level of care (*CSW will initial, date and re-position this form in  chart as items are completed):  Yes   Patient/family provided with [facility] Work Department's list of facilities offering this level of care within the geographic area requested by the patient (or if unable, by the patient's family).  Yes   Patient/family informed of their freedom to choose among providers that offer the needed level of care, that participate in Medicare, Medicaid or managed care program needed by the patient, have an available bed and are willing to accept the patient.  Yes   Patient/family informed of [facility]'s ownership interest in [facility] and [facility], as well as of the fact that they are under no obligation to receive care at these facilities.  PASRR submitted to EDS on 03/04/16     PASRR number received on 03/04/16     Existing PASRR number confirmed on       FL2 transmitted to all facilities in geographic area requested by pt/family on 03/04/16     FL2 transmitted to all facilities within larger geographic area on       Patient informed that his/her managed care company has contracts with or will negotiate with certain facilities, including the following:            Patient/family informed of bed offers received.  Patient chooses bed at       Physician recommends and patient chooses bed at      Patient to be transferred to   on  .  Patient to be transferred to facility by       Patient family notified on   of transfer.  Name of family member notified:        PHYSICIAN Please sign DNR     Additional Comment:    _______________________________________________ [name], LCSW 03/04/2016,  10:37 AM

## 2016-03-05 ENCOUNTER — Encounter: Payer: Self-pay | Admitting: Internal Medicine

## 2016-03-05 ENCOUNTER — Non-Acute Institutional Stay (SKILLED_NURSING_FACILITY): Payer: Medicare Other | Admitting: Internal Medicine

## 2016-03-05 DIAGNOSIS — M19042 Primary osteoarthritis, left hand: Secondary | ICD-10-CM | POA: Diagnosis not present

## 2016-03-05 DIAGNOSIS — R5381 Other malaise: Secondary | ICD-10-CM | POA: Diagnosis not present

## 2016-03-05 DIAGNOSIS — F0390 Unspecified dementia without behavioral disturbance: Secondary | ICD-10-CM | POA: Diagnosis not present

## 2016-03-05 DIAGNOSIS — J189 Pneumonia, unspecified organism: Secondary | ICD-10-CM | POA: Diagnosis not present

## 2016-03-05 DIAGNOSIS — D638 Anemia in other chronic diseases classified elsewhere: Secondary | ICD-10-CM

## 2016-03-05 DIAGNOSIS — I25768 Atherosclerosis of bypass graft of coronary artery of transplanted heart with other forms of angina pectoris: Secondary | ICD-10-CM | POA: Diagnosis not present

## 2016-03-05 DIAGNOSIS — K219 Gastro-esophageal reflux disease without esophagitis: Secondary | ICD-10-CM | POA: Diagnosis not present

## 2016-03-05 DIAGNOSIS — M19041 Primary osteoarthritis, right hand: Secondary | ICD-10-CM

## 2016-03-05 DIAGNOSIS — E441 Mild protein-calorie malnutrition: Secondary | ICD-10-CM

## 2016-03-05 DIAGNOSIS — E782 Mixed hyperlipidemia: Secondary | ICD-10-CM | POA: Diagnosis not present

## 2016-03-05 NOTE — Progress Notes (Signed)
LOCATION: [place]  PCP: Ria Bush, MD   Code Status: DNR  Goals of care: Advanced Directive information Advanced Directives 03/05/2016  Does patient have an advance directive? Yes  Type of Advance Directive Out of facility DNR (pink MOST or yellow form)  Does patient want to make changes to advanced directive? No - Patient declined  Copy of advanced directive(s) in chart? Yes       Extended Emergency Contact Information Primary Emergency Contact: H[place], [place] of [place] Phone: [phone] Relation: Daughter Secondary Emergency Contact: Teodora Medici [place] of [place] Phone: [phone] Relation: Grandaughter   Allergies  Allergen Reactions  . Hydrocodone Other (See Comments)    Possible reaction to hydrocodone vs oxycodone after leg surgery.  Altered mental status, aggressive.    . Tramadol Other (See Comments)    Possible source for altered mental status  . Nsaids Other (See Comments)    GI upset.   . Statins Other (See Comments)    Myalgia (thinks crestor)    Chief Complaint  Patient presents with  . New Admit To SNF    New Admission Visit     HPI:  Patient is a 82 y.o. male seen today for short term rehabilitation post hospital admission from 03/02/16-03/04/16 with chest pain. Nitrates helped his pain. Cyclic troponin were negative. he was seen by cardiology and given her age, dementia, preserved EF, medical management was opted. he was noted to have pneumonia and was started on antibiotics. he has history of CAD s/p CABG in 1999, HLD, dementia. he is seen in his room today.   Review of Systems:  Unable to obtain   Past Medical History:  Diagnosis Date  . Arthritis   . Carpal tunnel syndrome    new complaint 05/05/07 ov : rec  - trial of bilat wrist splints.  . Coronary artery disease    s/p6 vessel CABG 1999 Dr Jobie Quaker  . Hx of migraines   . Hypercholesterolemia   . Hypertension   . Kidney  stones remote  . Low back pain   . Osteoarthrosis, unspecified whether generalized or localized, unspecified site   . 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 Seasonal allergies   . Skin cancer    followed by Dr Denna Haggard   Past Surgical History:  Procedure Laterality Date  . CORONARY ARTERY BYPASS GRAFT     1999 DR Jobie Quaker   Social History:   reports that he has never smoked. He has never used smokeless tobacco. He reports that he does not drink alcohol or use drugs.  Family History  Problem Relation Age of Onset  . Heart failure Mother   . Arthritis Mother   . Alzheimer's disease Mother   . Diabetes Mother   . Heart failure Father   . Cancer Brother     lung (smoker)  . Parkinson's disease Brother     Medications:   Medication List       Accurate as of 03/05/16 10:42 AM. Always use your most recent med list.          acetaminophen 500 MG tablet Commonly known as:  TYLENOL Take 2 tablets (1,000 mg total) by mouth every 6 (six) hours as needed (pain).   aspirin EC 81 MG tablet Take 81 mg by  mouth daily.   CALCIUM-MAGNESIUM-VITAMIN D PO Take 1 tablet by mouth daily.   CoQ10 400 MG Caps Take 400 mg by mouth daily.   famotidine 20 MG tablet Commonly known as:  PEPCID Take 1 tablet (20 mg total) by mouth 2 (two) times daily.   Fish Oil 1000 MG Caps Take 1,000 mg by mouth 2 (two) times daily.   isosorbide mononitrate 30 MG 24 hr tablet Commonly known as:  IMDUR Take 30 mg by mouth daily.   levofloxacin 750 MG tablet Commonly known as:  LEVAQUIN Take 1 tablet (750 mg total) by mouth every other day.   multivitamin with minerals Tabs tablet Take 1 tablet by mouth daily.   MUSCLE RUB 10-15 % Crea Apply 1 application topically every Saturday. Apply to hands and shoulders       Immunizations: Immunization History    Administered Date(s) Administered  . PPD Test 03/04/2016     Physical Exam:  Vitals:   03/05/16 1026  BP: 108/60  Pulse: 69  Resp: 18  Temp: 98.8 F (37.1 C)  TempSrc: Oral  SpO2: 95%  Weight: 152 lb 1.6 oz (69 kg)  Height: 5\' 6"  (1.676 m)   Body mass index is 24.55 kg/m.  General- elderly male, well built, in no acute distress Head- normocephalic, atraumatic Throat- moist mucus membrane Eyes- PERRLA, EOMI, no pallor, no icterus, no discharge Neck- no cervical lymphadenopathy Cardiovascular- normal s1,s2, no murmur, no leg edema Respiratory- bilateral clear to auscultation, no wheeze, no rhonchi, no crackles, no use of accessory muscles, on o2 2l by Garceno Abdomen- bowel sounds present, soft, non tender Musculoskeletal- able to move all 4 extremities, generalized weakness, OA changes to his hands Neurological- alert and oriented to self Skin- warm and dry    Labs reviewed: Basic Metabolic Panel:  Recent Labs  02/20/16 1451 03/02/16 1214 03/03/16 0321  NA 138 135 136  K 4.3 3.9 4.1  CL 101 103 100*  CO2 31 23 27   GLUCOSE 99 107* 81  BUN 18 17 16   CREATININE 1.08 1.17 1.14  CALCIUM 9.3 9.2 9.0   Liver Function Tests:  Recent Labs  02/14/16 0910 02/16/16 1352 03/03/16 0321  AST 27 41 27  ALT 22 22 26   ALKPHOS 55 61 57  BILITOT 0.8 1.2 0.6  PROT 7.4 7.3 6.0*  ALBUMIN 4.0 3.7 2.9*   No results for input(s): LIPASE, AMYLASE in the last 8760 hours. No results for input(s): AMMONIA in the last 8760 hours. CBC:  Recent Labs  02/14/16 0910 02/16/16 1352 03/02/16 1214 03/03/16 0321  WBC 6.5 9.8 7.4 6.0  NEUTROABS 3.0 8.2* 5.4  --   HGB 15.2 15.2 12.8* 12.4*  HCT 44.3 43.6 39.4 37.8*  MCV 89.9 88.6 92.1 91.3  PLT 230.0 183 234 239   Cardiac Enzymes:  Recent Labs  02/16/16 1352 02/20/16 1451 03/02/16 1554 03/02/16 1755 03/02/16 2011  CKTOTAL 742* 109 50  --   --   TROPONINI  --   --  <0.03 <0.03 <0.03   BNP: Invalid input(s):  POCBNP CBG: No results for input(s): GLUCAP in the last 8760 hours.  Radiological Exams: Dg Chest 2 View  Result Date: 03/02/2016 CLINICAL DATA:  Mid chest pain intermittent over the last week. EXAM: CHEST  2 VIEW COMPARISON:  02/16/2016 FINDINGS: Previous median sternotomy and CABG. Mild cardiomegaly. Probable chronic obstructive lung disease with scarring at the bases. At the left base, there appears to be some volume loss and a small effusion  not seen previously. This could represent pneumonia. IMPRESSION: Previous CABG. Chronic lung disease. Newly seen density at the left base which could be left base pneumonia with a small effusion. Electronically Signed   By: Nelson Chimes M.D.   On: 03/02/2016 12:44   Dg Chest 2 View  Result Date: 02/16/2016 CLINICAL DATA:  Found in floor in living room, alert, but more confused than usual, incontinent of urine and stool. EMS called to get him up. Does not remember what happened. Denies any pain. EXAM: CHEST - 2 VIEW COMPARISON:  02/08/2008 FINDINGS: Mild bibasilar interstitial edema or infiltrates. Heart size upper limits normal.  Tortuous atheromatous aorta. No effusion.  No pneumothorax. Previous CABG. Degenerative changes in bilateral shoulders. Spondylitic changes in the thoracolumbar spine. IMPRESSION: 1. Mild bibasilar interstitial edema or infiltrates, new since previous. Electronically Signed   By: Lucrezia Europe M.D.   On: 02/16/2016 15:04   Dg Pelvis 1-2 Views  Result Date: 02/16/2016 CLINICAL DATA:  Found in floor in living room, alert, but more confused than usual, incontinent of urine and stool. EMS called to get him up. Does not remember what happened. Denies any pain. EXAM: PELVIS - 1-2 VIEW COMPARISON:  None. FINDINGS: There is no evidence of pelvic fracture or diastasis. No pelvic bone lesions are seen. Degenerative disc disease in the visualized lower lumbar spine. IMPRESSION: 1. Negative pelvis. 2. Lower lumbar degenerative disc disease.  Electronically Signed   By: Lucrezia Europe M.D.   On: 02/16/2016 15:05   Ct Head Wo Contrast  Result Date: 02/16/2016 CLINICAL DATA:  Altered mental status with incontinence and confusion EXAM: CT HEAD WITHOUT CONTRAST TECHNIQUE: Contiguous axial images were obtained from the base of the skull through the vertex without intravenous contrast. COMPARISON:  None. FINDINGS: Brain: There is moderate diffuse atrophy. There is no intracranial mass, hemorrhage, extra-axial fluid collection, or midline shift. There is patchy small vessel disease in the centra semiovale bilaterally. No acute infarct is evident. Vascular: The middle cerebral artery show symmetric attenuation bilaterally. No hyperdense vessel is evident. There are foci of calcification in each distal vertebral artery. There is also calcification in each carotid siphon. Skull: The bony calvarium appears intact. Sinuses/Orbits: There is mucosal thickening in several ethmoid air cells bilaterally. Frontal sinuses are nearly aplastic. Visualized paranasal sinuses elsewhere clear. Orbits appear symmetric bilaterally. Other: Mastoid air cells are clear. IMPRESSION: Moderate atrophy with patchy periventricular small vessel disease. No acute infarct evident. No hemorrhage or mass effect. There are areas of paranasal sinus disease. There are foci of arterial vascular calcification. Electronically Signed   By: Lowella Grip III M.D.   On: 02/16/2016 15:33    Assessment/Plan  Physical deconditioning Will have him work with physical therapy and occupational therapy team to help with gait training and muscle strengthening exercises.fall precautions. Skin care. Encourage to be out of bed.   CAP Continue and complete course of levaquin 750 mg qod on 03/07/16. Wean off o2 as tolerated  CAD No chest pain. Continue aspirin 81 mg daily, coenzyme q 10 daily, fish oil bid, imdur 30 mg qd  Dementia without behavior disturbance Get SLP to evaluate for aspiration  risk. Assistance with feeding. Supportive care. Pressure ulcer prophylaxis.   HLD Continue coenzyme and fish oil  Protein calorie malnutrition RD consult. Continue multivitamin. Add medpass 60 cc bid with meals. Monitor weight  Anemia of chronic disease Monitor cbc  OA Continue tylenol 1000 mg q6h prn pain  gerd Stable, continue famotidine 20 mg bid  Goals of care: short term rehabilitation   Labs/tests ordered: cbc, bmp 03/10/16  Family/ staff Communication: reviewed care plan with patient and nursing supervisor    Blanchie Serve, MD Internal Medicine [facility], [place] [address] Cell Phone (Monday-Friday 8 am - 5 pm): [phone] On Call: [phone] and follow prompts after 5 pm and on weekends Office Phone: [phone] Office Fax: [phone]

## 2016-03-06 ENCOUNTER — Encounter: Payer: Self-pay | Admitting: Internal Medicine

## 2016-03-10 LAB — CBC AND DIFFERENTIAL
HEMATOCRIT: 41 % (ref 41–53)
Hemoglobin: 13.3 g/dL — AB (ref 13.5–17.5)
Platelets: 323 10*3/uL (ref 150–399)
WBC: 9.1 10^3/mL

## 2016-03-10 LAB — BASIC METABOLIC PANEL
BUN: 20 mg/dL (ref 4–21)
Creatinine: 1.1 mg/dL (ref 0.6–1.3)
GLUCOSE: 90 mg/dL
Potassium: 4.5 mmol/L (ref 3.4–5.3)
SODIUM: 134 mmol/L — AB (ref 137–147)

## 2016-03-10 LAB — HEPATIC FUNCTION PANEL
ALT: 65 U/L — AB (ref 10–40)
AST: 50 U/L — AB (ref 14–40)
Alkaline Phosphatase: 77 U/L (ref 25–125)
BILIRUBIN, TOTAL: 0.9 mg/dL

## 2016-03-12 ENCOUNTER — Non-Acute Institutional Stay (SKILLED_NURSING_FACILITY): Payer: Medicare Other | Admitting: Family

## 2016-03-12 DIAGNOSIS — E871 Hypo-osmolality and hyponatremia: Secondary | ICD-10-CM | POA: Diagnosis not present

## 2016-03-12 DIAGNOSIS — G4700 Insomnia, unspecified: Secondary | ICD-10-CM | POA: Diagnosis not present

## 2016-03-12 DIAGNOSIS — E440 Moderate protein-calorie malnutrition: Secondary | ICD-10-CM

## 2016-03-12 DIAGNOSIS — R748 Abnormal levels of other serum enzymes: Secondary | ICD-10-CM | POA: Diagnosis not present

## 2016-03-12 DIAGNOSIS — R2681 Unsteadiness on feet: Secondary | ICD-10-CM

## 2016-03-12 NOTE — Progress Notes (Signed)
Location:  [place] Room Number: [place] of Service:  SNF [phone]) Provider:  Cassiopeia Florentino FNP-C   Ria Bush, MD  Patient Care Team: Ria Bush, MD as PCP - General (Family Medicine)  Extended Emergency Contact Information Primary Emergency Contact: Sarita Bottom [place] of [place] Phone: [phone] Relation: Daughter Secondary Emergency Contact: Teodora Medici [place] of [place] Phone: [phone] Relation: Grandaughter  Code Status:  DNR  Goals of care: Advanced Directive information Advanced Directives 03/05/2016  Does patient have an advance directive? Yes  Type of Advance Directive Out of facility DNR (pink MOST or yellow form)  Does patient want to make changes to advanced directive? No - Patient declined  Copy of advanced directive(s) in chart? Yes     Chief Complaint  Patient presents with  . Acute Visit    follow up labs     HPI:  Pt is a 82 y.o. male seen today at [facility] for an acute visit for evaluation of abnormal labs.He has a medical history of HTN, CAD,CABG,DJD, OA among other conditions. He is seen in his room today.His recent lab results showed  Alb 3.07, elevated AST 50, ALT 65  ( 03/10/2016). Of note he recent completed levaquin. Facility Nurse also reports patient not sleeping at night request sleep aid. He is pleasantly confused unable to provide HPI and ROS.    Past Medical History:  Diagnosis Date  . Arthritis   . Carpal tunnel syndrome    new complaint 05/05/07 ov : rec  - trial of bilat wrist splints.  . Coronary artery disease    s/p6 vessel CABG 1999 Dr Jobie Quaker  . Hx of migraines   . Hypercholesterolemia   . Hypertension   . Kidney stones remote  . Low back pain   . Osteoarthrosis, unspecified whether generalized or localized, unspecified site   . 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  futher  details from pt  . 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 Seasonal allergies   . Skin cancer    followed by Dr Denna Haggard   Past Surgical History:  Procedure Laterality Date  . CORONARY ARTERY BYPASS GRAFT     1999 DR Gearhardt    Allergies  Allergen Reactions  . Hydrocodone Other (See Comments)    Possible reaction to hydrocodone vs oxycodone after leg surgery.  Altered mental status, aggressive.    . Tramadol Other (See Comments)    Possible source for altered mental status  . Nsaids Other (See Comments)    GI upset.   . Statins Other (See Comments)    Myalgia (thinks crestor)      Medication List       Accurate as of 03/12/16  6:46 PM. Always use your most recent med list.          acetaminophen 500 MG tablet Commonly known as:  TYLENOL Take 2 tablets (1,000 mg total) by mouth every 6 (six) hours as needed (pain).   aspirin EC 81 MG tablet Take 81 mg by mouth daily.   CALCIUM-MAGNESIUM-VITAMIN D PO Take 1 tablet by mouth daily.   CoQ10 400 MG Caps Take 400 mg by mouth daily.   DECUBI-VITE PO Take 1 tablet by mouth.   famotidine 20 MG tablet Commonly known as:  PEPCID Take 1 tablet (20 mg total) by  mouth 2 (two) times daily.   feeding supplement (PRO-STAT SUGAR FREE 64) Liqd Take 30 mLs by mouth daily.   Fish Oil 1000 MG Caps Take 1,000 mg by mouth 2 (two) times daily.   isosorbide mononitrate 30 MG 24 hr tablet Commonly known as:  IMDUR Take 30 mg by mouth daily.   MUSCLE RUB 10-15 % Crea Apply 1 application topically every Saturday. Apply to hands and shoulders       Review of Systems  Unable to perform ROS: Dementia    Immunization History  Administered Date(s) Administered  . PPD Test 03/04/2016   Pertinent  Health Maintenance Due  Topic Date Due  . PNA vac Low Risk Adult (1 of 2 - PCV13) 07/23/1992  . INFLUENZA VACCINE  08/23/2016 (Originally 12/25/2015)      Vitals:   03/12/16 1700  BP: 115/78    Pulse: 82  Resp: 18  Temp: (!) 96.1 F (35.6 C)  SpO2: 98%  Weight: 157 lb (71.2 kg)  Height: 5\' 6"  (1.676 m)   Body mass index is 25.34 kg/m. Physical Exam  Constitutional: He appears well-developed. No distress.  Elderly pleasantly confused   HENT:  Head: Normocephalic.  Mouth/Throat: Oropharynx is clear and moist. No oropharyngeal exudate.  Eyes: Conjunctivae and EOM are normal. Pupils are equal, round, and reactive to light. Right eye exhibits no discharge. Left eye exhibits no discharge. No scleral icterus.  Neck: Normal range of motion. No JVD present. No thyromegaly present.  Cardiovascular: Normal rate, regular rhythm, normal heart sounds and intact distal pulses.  Exam reveals no gallop and no friction rub.   No murmur heard. Pulmonary/Chest: Effort normal and breath sounds normal. No respiratory distress. He has no wheezes. He has no rales.  Abdominal: Soft. Bowel sounds are normal. He exhibits no distension. There is no tenderness. There is no rebound and no guarding.  Genitourinary:  Genitourinary Comments: Incontinent   Musculoskeletal: He exhibits no edema, tenderness or deformity.  Bilat. Lower extremities decreased strength.   Lymphadenopathy:    He has no cervical adenopathy.  Neurological: He is alert.  Pleasantly confused   Skin: Skin is warm and dry. No rash noted. No erythema. No pallor.  Multiple dry, yellowish skin lesion on the head.   Psychiatric: He has a normal mood and affect.    Labs reviewed:  Recent Labs  02/20/16 1451 03/02/16 1214 03/03/16 0321  NA 138 135 136  K 4.3 3.9 4.1  CL 101 103 100*  CO2 31 23 27   GLUCOSE 99 107* 81  BUN 18 17 16   CREATININE 1.08 1.17 1.14  CALCIUM 9.3 9.2 9.0    Recent Labs  02/14/16 0910 02/16/16 1352 03/03/16 0321  AST 27 41 27  ALT 22 22 26   ALKPHOS 55 61 57  BILITOT 0.8 1.2 0.6  PROT 7.4 7.3 6.0*  ALBUMIN 4.0 3.7 2.9*    Recent Labs  02/14/16 0910 02/16/16 1352 03/02/16 1214  03/03/16 0321  WBC 6.5 9.8 7.4 6.0  NEUTROABS 3.0 8.2* 5.4  --   HGB 15.2 15.2 12.8* 12.4*  HCT 44.3 43.6 39.4 37.8*  MCV 89.9 88.6 92.1 91.3  PLT 230.0 183 234 239   Lab Results  Component Value Date   TSH 0.78 11/03/2006   No results found for: HGBA1C Lab Results  Component Value Date   CHOL 213 (H) 03/02/2016   HDL 26 (L) 03/02/2016   LDLCALC 156 (H) 03/02/2016   LDLDIRECT 124.0 02/14/2016   TRIG 153 (  H) 03/02/2016   CHOLHDL 8.2 03/02/2016   Assessment/Plan 1. Moderate protein-calorie malnutrition (HCC) Alb 3.07 ( 03/10/2016). RD already consulted. Continue on Prostat 30 cc daily for 30 days. Monitor BMP   2. Hyponatremia Na+ 134 asymptomatic. Continue to monitor BMP   3. Elevated liver enzymes AST 50, ALT 65 ( 03/10/2016). Previous liver enzymes wnl. He is status post Levaquin course completion. Will recheck CMP 03/17/2016   4. Insomnia   Start melatonin 3 mg Tablet at bedtime.   5. Unsteady gait   Fall and safety precautions.    Family/ staff Communication: Reviewed plan of care with facility Nurse supervisor.  Labs/tests ordered:   CMP 03/17/2016

## 2016-03-13 ENCOUNTER — Ambulatory Visit: Payer: Medicare Other | Admitting: Family Medicine

## 2016-03-17 LAB — HEPATIC FUNCTION PANEL
ALK PHOS: 77 U/L (ref 25–125)
ALT: 19 U/L (ref 10–40)
AST: 18 U/L (ref 14–40)
Bilirubin, Total: 0.4 mg/dL

## 2016-03-17 LAB — BASIC METABOLIC PANEL
BUN: 22 mg/dL — AB (ref 4–21)
CREATININE: 1.1 mg/dL (ref 0.6–1.3)
Glucose: 84 mg/dL
POTASSIUM: 4.5 mmol/L (ref 3.4–5.3)
Sodium: 138 mmol/L (ref 137–147)

## 2016-03-18 ENCOUNTER — Encounter: Payer: Self-pay | Admitting: Family Medicine

## 2016-03-21 ENCOUNTER — Encounter: Payer: Self-pay | Admitting: Family

## 2016-03-21 ENCOUNTER — Non-Acute Institutional Stay (SKILLED_NURSING_FACILITY): Payer: Medicare Other | Admitting: Family

## 2016-03-21 DIAGNOSIS — M19042 Primary osteoarthritis, left hand: Secondary | ICD-10-CM

## 2016-03-21 DIAGNOSIS — M19041 Primary osteoarthritis, right hand: Secondary | ICD-10-CM | POA: Diagnosis not present

## 2016-03-21 DIAGNOSIS — J189 Pneumonia, unspecified organism: Secondary | ICD-10-CM

## 2016-03-21 DIAGNOSIS — I10 Essential (primary) hypertension: Secondary | ICD-10-CM

## 2016-03-21 DIAGNOSIS — G4700 Insomnia, unspecified: Secondary | ICD-10-CM

## 2016-03-21 DIAGNOSIS — I25708 Atherosclerosis of coronary artery bypass graft(s), unspecified, with other forms of angina pectoris: Secondary | ICD-10-CM | POA: Diagnosis not present

## 2016-03-21 DIAGNOSIS — F0391 Unspecified dementia with behavioral disturbance: Secondary | ICD-10-CM | POA: Diagnosis not present

## 2016-03-21 DIAGNOSIS — E7800 Pure hypercholesterolemia, unspecified: Secondary | ICD-10-CM | POA: Diagnosis not present

## 2016-03-21 DIAGNOSIS — R2681 Unsteadiness on feet: Secondary | ICD-10-CM | POA: Diagnosis not present

## 2016-03-21 NOTE — Progress Notes (Signed)
Location:  [place] Room Number: [id number] Place of Service:  SNF [phone])  Provider: Marlowe Sax, FNP-C  PCP: Ria Bush, MD Patient Care Team: Ria Bush, MD as PCP - General (Family Medicine)  Extended Emergency Contact Information Primary Emergency Contact: Sarita Bottom [place] of [place] Phone: [phone] Relation: Daughter Secondary Emergency Contact: Teodora Medici [place] of [place] Phone: [phone] Relation: Grandaughter  Code Status: DNR Goals of care:  Advanced Directive information Advanced Directives 03/21/2016  Does patient have an advance directive? Yes  Type of Advance Directive Out of facility DNR (pink MOST or yellow form)  Does patient want to make changes to advanced directive? No - Patient declined  Copy of advanced directive(s) in chart? Yes     Allergies  Allergen Reactions  . Hydrocodone Other (See Comments)    Possible reaction to hydrocodone vs oxycodone after leg surgery.  Altered mental status, aggressive.    . Tramadol Other (See Comments)    Possible source for altered mental status  . Nsaids Other (See Comments)    GI upset.   . Statins Other (See Comments)    Myalgia (thinks crestor)    Chief Complaint  Patient presents with  . Discharge Note    Discharged from SNF    HPI:  82 y.o. male seen today at [facility] for discharge to [place] [facility]. He was here for short term rehabilitation post hospital admission from 03/02/16-03/04/16 with chest pain. Nitrates helped his pain. Cyclic troponin were negative. He was evaluated by cardiology and given her age, dementia, preserved EF, medical management was opted. He was also treated with antibiotics due to pneumonia. He has a medical history of HTN,CAD, OA, Carpal tunnel syndrome, s/p CABG 1999, Hyperlipidemia among other conditions. He is seen in his room today. He is pleasantly confused unable to  provide HPI and ROS information. He has worked well with PT/OT for ROM, Exercise, Gait stability and muscle strengthening now stable for discharge to Memory care unit.He spends most time on Geri-chair and transfers only with assistance. He is a high  fall risk.He has a stage 2 pressure ulcer progressive healing managed by wound care Nurse: cleansed with Normal saline, skin pre to peri-wound, Hydrogel to wound for moist healing environment, covered with collagen to stimulate granulation, Allevyn and changed every 3 days. He has gel overlay mattress for pressure ulcer relief and prevention of further breakdown. Prescription medication will be written x 1 month then patient to follow up with PCP in 1-2 weeks.Facility staff report no new concerns.      Past Medical History:  Diagnosis Date  . Arthritis   . Carpal tunnel syndrome    new complaint 05/05/07 ov : rec  - trial of bilat wrist splints.  . Coronary artery disease    s/p6 vessel CABG 1999 Dr Jobie Quaker  . Hx of migraines   . Hypercholesterolemia   . Hypertension   . Kidney stones remote  . Low back pain   . Osteoarthrosis, unspecified whether generalized or localized, unspecified site   . 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 Seasonal allergies   . Skin cancer    followed by Dr Denna Haggard    Past Surgical History:  Procedure Laterality Date  . CORONARY ARTERY  [place]      reports that he has never smoked. He has never used smokeless tobacco. He reports that he does not drink alcohol or use drugs. Social History   Social History  . Marital status: Widowed    Spouse name: N/A  . Number of children: 2  . Years of education: N/A   Occupational History  . retired    Social History Main Topics  . Smoking status: Never Smoker  . Smokeless tobacco: Never Used      Comment: pt stopped at age 7  . Alcohol use No  . Drug use: No  . Sexual activity: Not on file   Other Topics Concern  . 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   Functional Status Survey:    Allergies  Allergen Reactions  . Hydrocodone Other (See Comments)    Possible reaction to hydrocodone vs oxycodone after leg surgery.  Altered mental status, aggressive.    . Tramadol Other (See Comments)    Possible source for altered mental status  . Nsaids Other (See Comments)    GI upset.   . Statins Other (See Comments)    Myalgia (thinks crestor)    Pertinent  Health Maintenance Due  Topic Date Due  . PNA vac Low Risk Adult (1 of 2 - PCV13) 07/23/1992  . INFLUENZA VACCINE  08/23/2016 (Originally 12/25/2015)    Medications:   Medication List       Accurate as of 03/21/16 11:42 AM. Always use your most recent med list.          acetaminophen 500 MG tablet Commonly known as:  TYLENOL Take 2 tablets (1,000 mg total) by mouth every 6 (six) hours as needed (pain).   aspirin EC 81 MG tablet Take 81 mg by mouth daily.   CALCIUM-MAGNESIUM-VITAMIN D PO Take 1 tablet by mouth daily.   CoQ10 400 MG Caps Take 400 mg by mouth daily.   DECUBI-VITE PO Take 1 tablet by mouth.   famotidine 20 MG tablet Commonly known as:  PEPCID Take 1 tablet (20 mg total) by mouth 2 (two) times daily.   feeding supplement (PRO-STAT SUGAR FREE 64) Liqd Take 30 mLs by mouth daily.   Fish Oil 1000 MG Caps Take 1,000 mg by mouth 2 (two) times daily.   isosorbide mononitrate 30 MG 24 hr tablet Commonly known as:  IMDUR Take 30 mg by mouth daily.   Melatonin 3 MG Tabs Take 3 mg by mouth at bedtime.   MUSCLE RUB 10-15 % Crea Apply 1 application topically every Saturday. Apply to hands and shoulders   NUTRITIONAL SUPPLEMENT PO Give 60 cc of Med Pass by mouth twice daily.         Review of Systems  Unable to perform ROS: Dementia    Vitals:   03/21/16 1101  BP: 127/63  Pulse: 79  Resp: 18  Temp: 97.8 F (36.6 C)  SpO2: 96%  Weight: 157 lb (71.2 kg)  Height: 5\' 6"  (1.676 m)   Body mass index is 25.34 kg/m. Physical Exam  Constitutional: He appears well-developed. No distress.   pleasantly confused at baseline.   HENT:  Head: Normocephalic.  Mouth/Throat: Oropharynx is clear and moist. No oropharyngeal exudate.  Eyes: Conjunctivae and EOM are normal. Pupils are equal, round, and reactive to light. Right eye exhibits  no discharge. Left eye exhibits no discharge. No scleral icterus.  Neck: Normal range of motion. No JVD present. No thyromegaly present.  Cardiovascular: Normal rate, regular rhythm, normal heart sounds and intact distal pulses.  Exam reveals no gallop and no friction rub.   No murmur heard. Pulmonary/Chest: Effort normal and breath sounds normal. No respiratory distress. He has no wheezes. He has no rales.  Abdominal: Soft. Bowel sounds are normal. He exhibits no distension. There is no tenderness. There is no rebound and no guarding.  Genitourinary:  Genitourinary Comments: Incontinent for Both B/B   Musculoskeletal: He exhibits no edema, tenderness or deformity.  Unsteady gait.Moves x 4 extremities bilat. Lower extremities decreased strength.   Lymphadenopathy:    He has no cervical adenopathy.  Neurological: He is alert.  Pleasantly confused at baseline.  Skin: Skin is warm and dry. No rash noted. No erythema. No pallor.  Stage 2 to coccyx progressive healing wound bed pink in color and no drainage surrounding tissue without any signs of infections.   Psychiatric: He has a normal mood and affect.    Labs reviewed: Basic Metabolic Panel:  Recent Labs  02/20/16 1451 03/02/16 1214 03/03/16 0321 03/10/16  NA 138 135 136 134*  K 4.3 3.9 4.1 4.5  CL 101 103 100*  --   CO2 31 23 27   --   GLUCOSE 99 107* 81  --   BUN 18 17 16  20   CREATININE 1.08 1.17 1.14 1.1  CALCIUM 9.3 9.2 9.0  --    Liver Function Tests:  Recent Labs  02/14/16 0910 02/16/16 1352 03/03/16 0321 03/10/16  AST 27 41 27 50*  ALT 22 22 26  65*  ALKPHOS 55 61 57 77  BILITOT 0.8 1.2 0.6  --   PROT 7.4 7.3 6.0*  --   ALBUMIN 4.0 3.7 2.9*  --    CBC:  Recent Labs  02/14/16 0910 02/16/16 1352 03/02/16 1214 03/03/16 0321 03/10/16  WBC 6.5 9.8 7.4 6.0 9.1  NEUTROABS 3.0 8.2* 5.4  --   --   HGB 15.2 15.2 12.8* 12.4* 13.3*  HCT 44.3 43.6 39.4 37.8* 41  MCV 89.9 88.6 92.1 91.3  --   PLT 230.0 183 234 239 323   Cardiac Enzymes:  Recent Labs  02/16/16 1352 02/20/16 1451 03/02/16 1554 03/02/16 1755 03/02/16 2011  CKTOTAL 742* 109 50  --   --   TROPONINI  --   --  <0.03 <0.03 <0.03   Assessment/Plan:   HTN B/p stable. Continue on imdur. BMP in 1-2 weeks with PCP  CAD Chest pain free during rehab. Status post hospital admission from 03/02/16-03/04/16 with chest pain. Nitrates helped his pain. Cyclic troponin were negative. He was evaluated by cardiology and due to his advance age, dementia, preserved EF, medical management was opted. Continue on Imdur, ASA, fish oil and COQ10.   OA  Continue on Tylenol PRN and Muscle Rub.   Hyperlipidemia Continue fish oil and COQ10.Avoid statin due to advance age to prevent rhabdomyolysis.   Insomnia Continue on melatonin.   Unsteady gait  Has worked with PT/OT. Continue on Fall and safety precautions. He requires close monitoring due to high risk for falls.    CAP  Afebrile.Completed Levaquin 750 QOD 03/07/2016 with much improvement.CBC in 1-2 weeks with PCP.    Dementia  Pleasantly confused at baseline. Continue to assist with ADL's. Discharge to memory care unit for close monitoring.    Patient is being discharged with the following home  health services:   None   Patient is being discharged with the following durable medical equipment:  None    Patient has been advised to f/u  with their PCP in 1-2 weeks to for a transitions of care visit.  Social services at their facility was responsible for arranging this appointment.  Pt was provided with adequate prescriptions of noncontrolled medications to reach the scheduled appointment .  For controlled substances, a limited supply was provided as appropriate for the individual patient.  If the pt normally receives these medications from a pain clinic or has a contract with another physician, these medications should be received from that clinic or physician only).    Future labs/tests needed:  CBC, BMP in 1-2 weeks with PCP

## 2016-03-22 ENCOUNTER — Encounter: Payer: Self-pay | Admitting: Family Medicine

## 2016-03-31 DIAGNOSIS — R300 Dysuria: Secondary | ICD-10-CM | POA: Diagnosis not present

## 2016-04-01 ENCOUNTER — Encounter: Payer: Self-pay | Admitting: Family Medicine

## 2016-04-01 ENCOUNTER — Ambulatory Visit: Payer: Medicare Other | Admitting: Family Medicine

## 2016-04-21 DIAGNOSIS — R278 Other lack of coordination: Secondary | ICD-10-CM | POA: Diagnosis not present

## 2016-04-21 DIAGNOSIS — M6281 Muscle weakness (generalized): Secondary | ICD-10-CM | POA: Diagnosis not present

## 2016-04-22 ENCOUNTER — Encounter: Payer: Self-pay | Admitting: Family

## 2016-04-22 ENCOUNTER — Non-Acute Institutional Stay (SKILLED_NURSING_FACILITY): Payer: Medicare Other | Admitting: Family

## 2016-04-22 DIAGNOSIS — K219 Gastro-esophageal reflux disease without esophagitis: Secondary | ICD-10-CM | POA: Diagnosis not present

## 2016-04-22 DIAGNOSIS — M19041 Primary osteoarthritis, right hand: Secondary | ICD-10-CM

## 2016-04-22 DIAGNOSIS — I10 Essential (primary) hypertension: Secondary | ICD-10-CM

## 2016-04-22 DIAGNOSIS — M19042 Primary osteoarthritis, left hand: Secondary | ICD-10-CM | POA: Diagnosis not present

## 2016-04-22 DIAGNOSIS — G4700 Insomnia, unspecified: Secondary | ICD-10-CM | POA: Diagnosis not present

## 2016-04-22 DIAGNOSIS — D638 Anemia in other chronic diseases classified elsewhere: Secondary | ICD-10-CM

## 2016-04-22 NOTE — Progress Notes (Signed)
Location:  [place] Room Number: 104-P Place of Service:  SNF [phone]) Provider:  Freddi Schrager FNP-C   Ria Bush, MD  Patient Care Team: Ria Bush, MD as PCP - General (Family Medicine)  Extended Emergency Contact Information Primary Emergency Contact: Sarita Bottom [place] of [place] Phone: [phone] Relation: Daughter Secondary Emergency Contact: Teodora Medici [place] of [place] Phone: [phone] Relation: Grandaughter  Code Status: DNR  Goals of care: Advanced Directive information Advanced Directives 03/21/2016  Does Patient Have a Medical Advance Directive? Yes  Type of Advance Directive Out of facility DNR (pink MOST or yellow form)  Does patient want to make changes to medical advance directive? No - Patient declined  Copy of [place] in Chart? Yes     Chief Complaint  Patient presents with  . Medical Management of Chronic Issues    Routine Visit    HPI:  Pt is a 82 y.o. male seen today at [facility] for medical management of chronic diseases. He has a ,medical history of HTN, CAD with Bypass, OA, Hyperlipidemia among other conditions. He is seen in his room today. He is pleasantly confused though answers questions appropriate at times. He has had no recent fall episodes or hospital admission. He denies any acute issues this visit. Facility Nurse reports no new concerns. He continues to require total care assistance with ADL's.    Past Medical History:  Diagnosis Date  . Arthritis   . Carpal tunnel syndrome    new complaint 05/05/07 ov : rec  - trial of bilat wrist splints.  . Coronary artery disease    s/p6 vessel CABG 1999 Dr Jobie Quaker  . Hx of migraines   . Hypercholesterolemia   . Hypertension   . Kidney stones remote  . Low back pain   . Osteoarthrosis, unspecified whether generalized or localized, unspecified site   . 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 Seasonal allergies   . Skin cancer    followed by Dr Denna Haggard   Past Surgical History:  Procedure Laterality Date  . CORONARY ARTERY BYPASS GRAFT     1999 DR Gearhardt    Allergies  Allergen Reactions  . Hydrocodone Other (See Comments)    Possible reaction to hydrocodone vs oxycodone after leg surgery.  Altered mental status, aggressive.    . Tramadol Other (See Comments)    Possible source for altered mental status  . Nsaids Other (See Comments)    GI upset.   . Statins Other (See Comments)    Myalgia (thinks crestor)      Medication List       Accurate as of 04/22/16  9:56 AM. Always use your most recent med list.          acetaminophen 500 MG tablet Commonly known as:  TYLENOL Take 2 tablets (1,000 mg total) by mouth every 6 (six) hours as needed (pain).   aspirin EC 81 MG tablet Take 81 mg by mouth daily.   CALCIUM-MAGNESIUM-VITAMIN D PO Take 1 tablet by mouth daily.   CoQ10 400 MG Caps Take 400 mg by mouth daily.   DECUBI-VITE PO Take 1 tablet by mouth.   famotidine 20 MG tablet Commonly known as:  PEPCID Take 1 tablet (20  mg total) by mouth 2 (two) times daily.   feeding supplement (PRO-STAT SUGAR FREE 64) Liqd Take 30 mLs by mouth daily.   isosorbide mononitrate 30 MG 24 hr tablet Commonly known as:  IMDUR Take 15 mg by mouth daily.   loratadine 10 MG tablet Commonly known as:  CLARITIN Take 10 mg by mouth daily.   Melatonin 3 MG Tabs Take 3 mg by mouth at bedtime.   MUSCLE RUB 10-15 % Crea Apply 1 application topically every Saturday. Apply to hands and shoulders   NUTRITIONAL SUPPLEMENT PO Give 60 cc of Med Pass by mouth twice daily.       Review of Systems  Unable to perform ROS: Dementia    Immunization History  Administered Date(s) Administered  . PPD Test 03/04/2016     Pertinent  Health Maintenance Due  Topic Date Due  . PNA vac Low Risk Adult (1 of 2 - PCV13) 07/23/1992  . INFLUENZA VACCINE  08/23/2016 (Originally 12/25/2015)   No flowsheet data found. Functional Status Survey:    Vitals:   04/22/16 0952  BP: (!) 101/54  Pulse: 83  Resp: 16  Temp: 97.6 F (36.4 C)  TempSrc: Oral  SpO2: 96%  Weight: 158 lb 11.2 oz (72 kg)  Height: 5\' 6"  (1.676 m)   Body mass index is 25.61 kg/m. Physical Exam  Constitutional: He appears well-developed. No distress.  confused at baseline.   HENT:  Head: Normocephalic.  Mouth/Throat: Oropharynx is clear and moist. No oropharyngeal exudate.  Eyes: Conjunctivae and EOM are normal. Pupils are equal, round, and reactive to light. Right eye exhibits no discharge. Left eye exhibits no discharge. No scleral icterus.  Neck: Normal range of motion. No JVD present. No thyromegaly present.  Cardiovascular: Normal rate, regular rhythm, normal heart sounds and intact distal pulses.  Exam reveals no gallop and no friction rub.   No murmur heard. Pulmonary/Chest: Effort normal and breath sounds normal. No respiratory distress. He has no wheezes. He has no rales.  Abdominal: Soft. Bowel sounds are normal. He exhibits no distension. There is no tenderness. There is no rebound and no guarding.  Genitourinary:  Genitourinary Comments: Incontinent for Both B/B   Musculoskeletal: He exhibits no edema, tenderness or deformity.  Moves x 4 extremities. Spends most time on Geri-chair.   Lymphadenopathy:    He has no cervical adenopathy.  Neurological: He is alert.  Pleasantly confused at baseline.  Skin: Skin is warm and dry. No rash noted. No erythema. No pallor.  Sacral wound managed by wound Nurse not visualized this visit patient sitting on Geri-chair   Psychiatric: He has a normal mood and affect.    Labs reviewed:  Recent Labs  02/20/16 1451 03/02/16 1214 03/03/16 0321 03/10/16 03/17/16  NA 138 135 136 134* 138   K 4.3 3.9 4.1 4.5 4.5  CL 101 103 100*  --   --   CO2 31 23 27   --   --   GLUCOSE 99 107* 81  --   --   BUN 18 17 16 20  22*  CREATININE 1.08 1.17 1.14 1.1 1.1  CALCIUM 9.3 9.2 9.0  --   --     Recent Labs  02/14/16 0910 02/16/16 1352 03/03/16 0321 03/10/16 03/17/16  AST 27 41 27 50* 18  ALT 22 22 26  65* 19  ALKPHOS 55 61 57 77 77  BILITOT 0.8 1.2 0.6  --   --   PROT 7.4 7.3 6.0*  --   --  ALBUMIN 4.0 3.7 2.9*  --   --     Recent Labs  02/14/16 0910 02/16/16 1352 03/02/16 1214 03/03/16 0321 03/10/16  WBC 6.5 9.8 7.4 6.0 9.1  NEUTROABS 3.0 8.2* 5.4  --   --   HGB 15.2 15.2 12.8* 12.4* 13.3*  HCT 44.3 43.6 39.4 37.8* 41  MCV 89.9 88.6 92.1 91.3  --   PLT 230.0 183 234 239 323   Lab Results  Component Value Date   TSH 0.78 11/03/2006   No results found for: HGBA1C Lab Results  Component Value Date   CHOL 213 (H) 03/02/2016   HDL 26 (L) 03/02/2016   LDLCALC 156 (H) 03/02/2016   LDLDIRECT 124.0 02/14/2016   TRIG 153 (H) 03/02/2016   CHOLHDL 8.2 03/02/2016   Assessment/Plan HTN Stable. Continue to monitor.   OA  Continue current pain regimen. Continue on restorative exercises.   Anemia  Continue to monitor CBC  Insomnia  Melatonin effective.   GERD Continue on Pepcid.    Family/ staff Communication: Reviewed plan of care with patient and facility Nurse supervisor.   Labs/tests ordered: None

## 2016-05-20 ENCOUNTER — Encounter: Payer: Self-pay | Admitting: Family

## 2016-05-20 ENCOUNTER — Non-Acute Institutional Stay (SKILLED_NURSING_FACILITY): Payer: Medicare Other | Admitting: Family

## 2016-05-20 DIAGNOSIS — R2681 Unsteadiness on feet: Secondary | ICD-10-CM

## 2016-05-20 DIAGNOSIS — M19042 Primary osteoarthritis, left hand: Secondary | ICD-10-CM | POA: Diagnosis not present

## 2016-05-20 DIAGNOSIS — I25700 Atherosclerosis of coronary artery bypass graft(s), unspecified, with unstable angina pectoris: Secondary | ICD-10-CM

## 2016-05-20 DIAGNOSIS — M19041 Primary osteoarthritis, right hand: Secondary | ICD-10-CM

## 2016-05-20 DIAGNOSIS — I10 Essential (primary) hypertension: Secondary | ICD-10-CM

## 2016-05-20 NOTE — Progress Notes (Signed)
Location:  [place] Room Number: [id number] Place of Service:  SNF [phone]) Provider:  Lesbia Ottaway FNP-C   Ria Bush, MD  Patient Care Team: Ria Bush, MD as PCP - General (Family Medicine)  Extended Emergency Contact Information Primary Emergency Contact: Sarita Bottom [place] Phone: [phone] Relation: Daughter Secondary Emergency Contact: Teodora Medici [place] of [place] Phone: [phone] Relation: Grandaughter  Code Status: DNR  Goals of care: Advanced Directive information Advanced Directives 05/20/2016  Does Patient Have a Medical Advance Directive? Yes  Type of Advance Directive Out of facility DNR (pink MOST or yellow form)  Does patient want to make changes to medical advance directive? No - Patient declined  Copy of [place] in Chart? No - copy requested     Chief Complaint  Patient presents with  . Medical Management of Chronic Issues    HPI:  Pt is a 82 y.o. male seen today at [facility] for medical management of chronic diseases. He has a medical history of HTN, CAD with Bypass, OA, Hyperlipidemia among other conditions. He is seen in his room today. He denies any acute issues this visit. He is more alert and oriented this visit.He complains of arthritic pain on hands states tylenol effective.He has had no  fall episodes, weight changes or hospital admission since prior visit.Facility Nurse reports no new concerns.   Past Medical History:  Diagnosis Date  . Arthritis   . Carpal tunnel syndrome    new complaint 05/05/07 ov : rec  - trial of bilat wrist splints.  . Coronary artery disease    s/p6 vessel CABG 1999 Dr Jobie Quaker  . Hx of migraines   . Hypercholesterolemia   . Hypertension   . Kidney stones remote  . Low back pain   . Osteoarthrosis, unspecified whether generalized or localized, unspecified site   . 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 Seasonal allergies   . Skin cancer    followed by Dr Denna Haggard   Past Surgical History:  Procedure Laterality Date  . CORONARY ARTERY BYPASS GRAFT     1999 DR Gearhardt    Allergies  Allergen Reactions  . Hydrocodone Other (See Comments)    Possible reaction to hydrocodone vs oxycodone after leg surgery.  Altered mental status, aggressive.    . Tramadol Other (See Comments)    Possible source for altered mental status  . Nsaids Other (See Comments)    GI upset.   . Statins Other (See Comments)    Myalgia (thinks crestor)    Allergies as of 05/20/2016      Reactions   Hydrocodone Other (See Comments)   Possible reaction to hydrocodone vs oxycodone after leg surgery.  Altered mental status, aggressive.     Tramadol Other (See Comments)   Possible source for altered mental status   Nsaids Other (See Comments)   GI upset.    Statins Other (See Comments)   Myalgia (thinks crestor)      Medication List       Accurate as of 05/20/16  7:22 PM. Always use your most recent med list.          acetaminophen 500 MG tablet Commonly known as:  TYLENOL Take 2 tablets (1,000  mg total) by mouth every 6 (six) hours as needed (pain).   aspirin EC 81 MG tablet Take 81 mg by mouth daily.   CALCIUM-MAGNESIUM-VITAMIN D PO Take 1 tablet by mouth daily.   COQ10-VITAMIN E PO Take 2 tablets by mouth daily. 200-10 units   DECUBI-VITE PO Take 1 tablet by mouth.   famotidine 20 MG tablet Commonly known as:  PEPCID Take 1 tablet (20 mg total) by mouth 2 (two) times daily.   isosorbide mononitrate 30 MG 24 hr tablet Commonly known as:  IMDUR Take 15 mg by mouth daily.   loratadine 10 MG tablet Commonly known as:  CLARITIN Take 10 mg by mouth daily.   Melatonin 3 MG Tabs Take 3 mg by mouth at bedtime.   MUSCLE RUB 10-15 %  Crea Apply 1 application topically every Saturday. Apply to hands and shoulders   NUTRITIONAL SUPPLEMENT PO Take 60 mLs by mouth 2 (two) times daily. MedPass       Review of Systems  Unable to perform ROS: Dementia    Immunization History  Administered Date(s) Administered  . PPD Test 03/04/2016   Pertinent  Health Maintenance Due  Topic Date Due  . INFLUENZA VACCINE  08/23/2016 (Originally 12/25/2015)  . PNA vac Low Risk Adult (1 of 2 - PCV13) 05/26/2017 (Originally 07/23/1992)    Vitals:   05/20/16 1437  BP: 109/61  Pulse: 76  Resp: 18  Temp: 98 F (36.7 C)  TempSrc: Oral  SpO2: 94%  Weight: 158 lb 11.7 oz (72 kg)  Height: 5\' 6"  (1.676 m)   Body mass index is 25.62 kg/m. Physical Exam  Constitutional: He appears well-developed. No distress.  Pleasantly confused at baseline though more alert and orient this visit. Verbalized needs.   HENT:  Head: Normocephalic.  Mouth/Throat: Oropharynx is clear and moist. No oropharyngeal exudate.  Eyes: Conjunctivae and EOM are normal. Pupils are equal, round, and reactive to light. Right eye exhibits no discharge. Left eye exhibits no discharge. No scleral icterus.  Neck: Normal range of motion. No JVD present. No thyromegaly present.  Cardiovascular: Normal rate, regular rhythm, normal heart sounds and intact distal pulses.  Exam reveals no gallop and no friction rub.   No murmur heard. Pulmonary/Chest: Effort normal and breath sounds normal. No respiratory distress. He has no wheezes. He has no rales.  Abdominal: Soft. Bowel sounds are normal. He exhibits no distension. There is no tenderness. There is no rebound and no guarding.  Genitourinary:  Genitourinary Comments: Incontinent  Musculoskeletal: He exhibits no edema, tenderness or deformity.  Moves x 4 extremities but unsteady.   Lymphadenopathy:    He has no cervical adenopathy.  Neurological: He is alert.  Pleasantly confused at baseline.  Skin: Skin is warm and dry.  No rash noted. No erythema. No pallor.  Bilateral top of the foot ulcer progressive healing.  Psychiatric: He has a normal mood and affect.    Labs reviewed:  Recent Labs  02/20/16 1451 03/02/16 1214 03/03/16 0321 03/10/16 03/17/16  NA 138 135 136 134* 138  K 4.3 3.9 4.1 4.5 4.5  CL 101 103 100*  --   --   CO2 31 23 27   --   --   GLUCOSE 99 107* 81  --   --   BUN 18 17 16 20  22*  CREATININE 1.08 1.17 1.14 1.1 1.1  CALCIUM 9.3 9.2 9.0  --   --     Recent Labs  02/14/16 0910 02/16/16 1352 03/03/16  0321 03/10/16 03/17/16  AST 27 41 27 50* 18  ALT 22 22 26  65* 19  ALKPHOS 55 61 57 77 77  BILITOT 0.8 1.2 0.6  --   --   PROT 7.4 7.3 6.0*  --   --   ALBUMIN 4.0 3.7 2.9*  --   --     Recent Labs  02/14/16 0910 02/16/16 1352 03/02/16 1214 03/03/16 0321 03/10/16  WBC 6.5 9.8 7.4 6.0 9.1  NEUTROABS 3.0 8.2* 5.4  --   --   HGB 15.2 15.2 12.8* 12.4* 13.3*  HCT 44.3 43.6 39.4 37.8* 41  MCV 89.9 88.6 92.1 91.3  --   PLT 230.0 183 234 239 323   Lab Results  Component Value Date   TSH 0.78 11/03/2006   No results found for: HGBA1C Lab Results  Component Value Date   CHOL 213 (H) 03/02/2016   HDL 26 (L) 03/02/2016   LDLCALC 156 (H) 03/02/2016   LDLDIRECT 124.0 02/14/2016   TRIG 153 (H) 03/02/2016   CHOLHDL 8.2 03/02/2016   Assessment/Plan HTN Stable. Continue to monitor. Monitor BMP   CAD Chest pain free. Continue on ASA, Coenzyme Q10 and Imdur.   Unsteady Gait Continue on restorative therapy. Fall and safety precautions.   OA  Continue Tylenol PRN. Continue on restorative exercises.    Family/ staff Communication: Reviewed plan of care with patient and facility Nurse supervisor.   Labs/tests ordered: None

## 2016-06-03 DIAGNOSIS — R2689 Other abnormalities of gait and mobility: Secondary | ICD-10-CM | POA: Diagnosis not present

## 2016-06-03 DIAGNOSIS — R488 Other symbolic dysfunctions: Secondary | ICD-10-CM | POA: Diagnosis not present

## 2016-06-03 DIAGNOSIS — R1312 Dysphagia, oropharyngeal phase: Secondary | ICD-10-CM | POA: Diagnosis not present

## 2016-06-04 DIAGNOSIS — R1312 Dysphagia, oropharyngeal phase: Secondary | ICD-10-CM | POA: Diagnosis not present

## 2016-06-04 DIAGNOSIS — R2689 Other abnormalities of gait and mobility: Secondary | ICD-10-CM | POA: Diagnosis not present

## 2016-06-04 DIAGNOSIS — R488 Other symbolic dysfunctions: Secondary | ICD-10-CM | POA: Diagnosis not present

## 2016-06-05 DIAGNOSIS — R2689 Other abnormalities of gait and mobility: Secondary | ICD-10-CM | POA: Diagnosis not present

## 2016-06-05 DIAGNOSIS — R1312 Dysphagia, oropharyngeal phase: Secondary | ICD-10-CM | POA: Diagnosis not present

## 2016-06-05 DIAGNOSIS — R488 Other symbolic dysfunctions: Secondary | ICD-10-CM | POA: Diagnosis not present

## 2016-06-06 DIAGNOSIS — R2689 Other abnormalities of gait and mobility: Secondary | ICD-10-CM | POA: Diagnosis not present

## 2016-06-06 DIAGNOSIS — R1312 Dysphagia, oropharyngeal phase: Secondary | ICD-10-CM | POA: Diagnosis not present

## 2016-06-06 DIAGNOSIS — R488 Other symbolic dysfunctions: Secondary | ICD-10-CM | POA: Diagnosis not present

## 2016-06-08 DIAGNOSIS — R1312 Dysphagia, oropharyngeal phase: Secondary | ICD-10-CM | POA: Diagnosis not present

## 2016-06-08 DIAGNOSIS — R2689 Other abnormalities of gait and mobility: Secondary | ICD-10-CM | POA: Diagnosis not present

## 2016-06-08 DIAGNOSIS — R488 Other symbolic dysfunctions: Secondary | ICD-10-CM | POA: Diagnosis not present

## 2016-06-09 DIAGNOSIS — R2689 Other abnormalities of gait and mobility: Secondary | ICD-10-CM | POA: Diagnosis not present

## 2016-06-09 DIAGNOSIS — R1312 Dysphagia, oropharyngeal phase: Secondary | ICD-10-CM | POA: Diagnosis not present

## 2016-06-09 DIAGNOSIS — R488 Other symbolic dysfunctions: Secondary | ICD-10-CM | POA: Diagnosis not present

## 2016-06-10 DIAGNOSIS — R488 Other symbolic dysfunctions: Secondary | ICD-10-CM | POA: Diagnosis not present

## 2016-06-10 DIAGNOSIS — R2689 Other abnormalities of gait and mobility: Secondary | ICD-10-CM | POA: Diagnosis not present

## 2016-06-10 DIAGNOSIS — R1312 Dysphagia, oropharyngeal phase: Secondary | ICD-10-CM | POA: Diagnosis not present

## 2016-06-12 DIAGNOSIS — R488 Other symbolic dysfunctions: Secondary | ICD-10-CM | POA: Diagnosis not present

## 2016-06-12 DIAGNOSIS — R1312 Dysphagia, oropharyngeal phase: Secondary | ICD-10-CM | POA: Diagnosis not present

## 2016-06-12 DIAGNOSIS — R2689 Other abnormalities of gait and mobility: Secondary | ICD-10-CM | POA: Diagnosis not present

## 2016-06-13 DIAGNOSIS — R488 Other symbolic dysfunctions: Secondary | ICD-10-CM | POA: Diagnosis not present

## 2016-06-13 DIAGNOSIS — R1312 Dysphagia, oropharyngeal phase: Secondary | ICD-10-CM | POA: Diagnosis not present

## 2016-06-13 DIAGNOSIS — R2689 Other abnormalities of gait and mobility: Secondary | ICD-10-CM | POA: Diagnosis not present

## 2016-06-14 DIAGNOSIS — R1312 Dysphagia, oropharyngeal phase: Secondary | ICD-10-CM | POA: Diagnosis not present

## 2016-06-14 DIAGNOSIS — R2689 Other abnormalities of gait and mobility: Secondary | ICD-10-CM | POA: Diagnosis not present

## 2016-06-14 DIAGNOSIS — R488 Other symbolic dysfunctions: Secondary | ICD-10-CM | POA: Diagnosis not present

## 2016-06-16 ENCOUNTER — Non-Acute Institutional Stay (SKILLED_NURSING_FACILITY): Payer: Medicare Other | Admitting: Family

## 2016-06-16 DIAGNOSIS — K219 Gastro-esophageal reflux disease without esophagitis: Secondary | ICD-10-CM

## 2016-06-16 DIAGNOSIS — R488 Other symbolic dysfunctions: Secondary | ICD-10-CM | POA: Diagnosis not present

## 2016-06-16 DIAGNOSIS — M19042 Primary osteoarthritis, left hand: Secondary | ICD-10-CM

## 2016-06-16 DIAGNOSIS — R2689 Other abnormalities of gait and mobility: Secondary | ICD-10-CM | POA: Diagnosis not present

## 2016-06-16 DIAGNOSIS — R1312 Dysphagia, oropharyngeal phase: Secondary | ICD-10-CM | POA: Diagnosis not present

## 2016-06-16 DIAGNOSIS — M19041 Primary osteoarthritis, right hand: Secondary | ICD-10-CM | POA: Diagnosis not present

## 2016-06-16 DIAGNOSIS — L89890 Pressure ulcer of other site, unstageable: Secondary | ICD-10-CM | POA: Diagnosis not present

## 2016-06-16 DIAGNOSIS — I10 Essential (primary) hypertension: Secondary | ICD-10-CM

## 2016-06-16 NOTE — Progress Notes (Signed)
Location:  [place] Room Number: [place] of Service:  SNF (31) Provider:  Izic Stfort FNP-C   Ria Bush, MD  Patient Care Team: Ria Bush, MD as PCP - General (Family Medicine)  Extended Emergency Contact Information Primary Emergency Contact: Sarita Bottom [place] of [place] Phone: [phone] Relation: Daughter Secondary Emergency Contact: Teodora Medici [place] of [place] Phone: [phone] Relation: Grandaughter  Code Status: DNR  Goals of care: Advanced Directive information Advanced Directives 05/20/2016  Does Patient Have a Medical Advance Directive? Yes  Type of Advance Directive Out of facility DNR (pink MOST or yellow form)  Does patient want to make changes to medical advance directive? No - Patient declined  Copy of [place] in Chart? No - copy requested     Chief Complaint  Patient presents with  . Medical Management of Chronic Issues    HPI:  Pt is a 83 y.o. male seen today at [facility] for medical management of chronic diseases. He has a medical history of HTN, CAD with Bypass, OA, Hyperlipidemia among other conditions. He is seen in his room today. He denies any acute issues this visit.he has bilateral pressure ulcer on top of the foot managed by wound care Nurse progressive healing noted. No signs of infections noted. He has had no recent fall episode, weight changes or hospital admission since prior visit. Facility Nurse reports no new concerns.   Past Medical History:  Diagnosis Date  . Arthritis   . Carpal tunnel syndrome    new complaint 05/05/07 ov : rec  - trial of bilat wrist splints.  . Coronary artery disease    s/p6 vessel CABG 1999 Dr Jobie Quaker  . Hx of migraines   . Hypercholesterolemia   . Hypertension   . Kidney stones remote  . Low back pain   . Osteoarthrosis, unspecified whether generalized or localized,  unspecified site   . 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 Seasonal allergies   . Skin cancer    followed by Dr Denna Haggard   Past Surgical History:  Procedure Laterality Date  . CORONARY ARTERY BYPASS GRAFT     1999 DR Gearhardt    Allergies  Allergen Reactions  . Hydrocodone Other (See Comments)    Possible reaction to hydrocodone vs oxycodone after leg surgery.  Altered mental status, aggressive.    . Tramadol Other (See Comments)    Possible source for altered mental status  . Nsaids Other (See Comments)    GI upset.   . Statins Other (See Comments)    Myalgia (thinks crestor)    Allergies as of 06/16/2016      Reactions   Hydrocodone Other (See Comments)   Possible reaction to hydrocodone vs oxycodone after leg surgery.  Altered mental status, aggressive.     Tramadol Other (See Comments)   Possible source for altered mental status   Nsaids Other (See Comments)   GI upset.    Statins Other (See Comments)   Myalgia (thinks crestor)      Medication List       Accurate as of 06/16/16  7:20 PM. Always use your most recent med list.          acetaminophen 500 MG tablet  Commonly known as:  TYLENOL Take 2 tablets (1,000 mg total) by mouth every 6 (six) hours as needed (pain).   aspirin EC 81 MG tablet Take 81 mg by mouth daily.   CALCIUM-MAGNESIUM-VITAMIN D PO Take 1 tablet by mouth daily.   COQ10-VITAMIN E PO Take 2 tablets by mouth daily. 200-10 units   DECUBI-VITE PO Take 1 tablet by mouth.   famotidine 20 MG tablet Commonly known as:  PEPCID Take 1 tablet (20 mg total) by mouth 2 (two) times daily.   isosorbide mononitrate 30 MG 24 hr tablet Commonly known as:  IMDUR Take 15 mg by mouth daily.   loratadine 10 MG tablet Commonly known as:  CLARITIN Take 10 mg by mouth daily.   Melatonin 3 MG Tabs Take 3 mg by  mouth at bedtime.   MUSCLE RUB 10-15 % Crea Apply 1 application topically every Saturday. Apply to hands and shoulders   NUTRITIONAL SUPPLEMENT PO Take 60 mLs by mouth 2 (two) times daily. MedPass       Review of Systems  Unable to perform ROS: Dementia    Immunization History  Administered Date(s) Administered  . PPD Test 03/04/2016   Pertinent  Health Maintenance Due  Topic Date Due  . INFLUENZA VACCINE  08/23/2016 (Originally 12/25/2015)  . PNA vac Low Risk Adult (1 of 2 - PCV13) 05/26/2017 (Originally 07/23/1992)    Vitals:   06/16/16 1045  Resp: 18  Temp: 98.2 F (36.8 C)  SpO2: 98%  Weight: 157 lb 14.4 oz (71.6 kg)  Height: 5\' 6"  (1.676 m)   Body mass index is 25.49 kg/m. Physical Exam  Constitutional: He appears well-developed. No distress.   confused at baseline at his baseline.   HENT:  Head: Normocephalic.  Mouth/Throat: Oropharynx is clear and moist. No oropharyngeal exudate.  Eyes: Conjunctivae and EOM are normal. Pupils are equal, round, and reactive to light. Right eye exhibits no discharge. Left eye exhibits no discharge. No scleral icterus.  Neck: Normal range of motion. No JVD present. No thyromegaly present.  Cardiovascular: Normal rate, regular rhythm, normal heart sounds and intact distal pulses.  Exam reveals no gallop and no friction rub.   No murmur heard. Pulmonary/Chest: Effort normal and breath sounds normal. No respiratory distress. He has no wheezes. He has no rales.  Abdominal: Soft. Bowel sounds are normal. He exhibits no distension. There is no tenderness. There is no rebound and no guarding.  Genitourinary:  Genitourinary Comments: Incontinent  Musculoskeletal: He exhibits no edema, tenderness or deformity.  Moves x 4 extremities. Unsteady gait.    Lymphadenopathy:    He has no cervical adenopathy.  Neurological: He is alert.  Pleasantly confused at baseline.  Skin: Skin is warm and dry. No rash noted. No erythema. No pallor.    Bilateral top of the foot ulcer progressive healing. Surrounding skin tissue without any signs of infections.   Psychiatric: He has a normal mood and affect.    Labs reviewed:  Recent Labs  02/20/16 1451 03/02/16 1214 03/03/16 0321 03/10/16 03/17/16  NA 138 135 136 134* 138  K 4.3 3.9 4.1 4.5 4.5  CL 101 103 100*  --   --   CO2 31 23 27   --   --   GLUCOSE 99 107* 81  --   --   BUN 18 17 16 20  22*  CREATININE 1.08 1.17 1.14 1.1 1.1  CALCIUM 9.3 9.2 9.0  --   --     Recent Labs  02/14/16 0910 02/16/16 1352 03/03/16 0321 03/10/16 03/17/16  AST 27 41 27 50* 18  ALT 22 22 26  65* 19  ALKPHOS 55 61 57 77 77  BILITOT 0.8 1.2 0.6  --   --   PROT 7.4 7.3 6.0*  --   --   ALBUMIN 4.0 3.7 2.9*  --   --     Recent Labs  02/14/16 0910 02/16/16 1352 03/02/16 1214 03/03/16 0321 03/10/16  WBC 6.5 9.8 7.4 6.0 9.1  NEUTROABS 3.0 8.2* 5.4  --   --   HGB 15.2 15.2 12.8* 12.4* 13.3*  HCT 44.3 43.6 39.4 37.8* 41  MCV 89.9 88.6 92.1 91.3  --   PLT 230.0 183 234 239 323   Lab Results  Component Value Date   TSH 0.78 11/03/2006   No results found for: HGBA1C Lab Results  Component Value Date   CHOL 213 (H) 03/02/2016   HDL 26 (L) 03/02/2016   LDLCALC 156 (H) 03/02/2016   LDLDIRECT 124.0 02/14/2016   TRIG 153 (H) 03/02/2016   CHOLHDL 8.2 03/02/2016   Assessment/Plan Pressure ulcer of feet  Progressive healing noted. No signs of infections. Continue with wound care. Continue decu-vite and protein supplements.   HTN Stable. Continue to monitor. Monitor BMP    GERD Continue on famotidine.   OA  Continue Tylenol PRN. Continue on restorative exercises.    Family/ staff Communication: Reviewed plan of care with patient and facility Nurse supervisor.   Labs/tests ordered: None

## 2016-06-17 DIAGNOSIS — R488 Other symbolic dysfunctions: Secondary | ICD-10-CM | POA: Diagnosis not present

## 2016-06-17 DIAGNOSIS — R2689 Other abnormalities of gait and mobility: Secondary | ICD-10-CM | POA: Diagnosis not present

## 2016-06-17 DIAGNOSIS — R1312 Dysphagia, oropharyngeal phase: Secondary | ICD-10-CM | POA: Diagnosis not present

## 2016-06-18 DIAGNOSIS — R488 Other symbolic dysfunctions: Secondary | ICD-10-CM | POA: Diagnosis not present

## 2016-06-18 DIAGNOSIS — M79674 Pain in right toe(s): Secondary | ICD-10-CM | POA: Diagnosis not present

## 2016-06-18 DIAGNOSIS — I739 Peripheral vascular disease, unspecified: Secondary | ICD-10-CM | POA: Diagnosis not present

## 2016-06-18 DIAGNOSIS — R2689 Other abnormalities of gait and mobility: Secondary | ICD-10-CM | POA: Diagnosis not present

## 2016-06-18 DIAGNOSIS — I70203 Unspecified atherosclerosis of native arteries of extremities, bilateral legs: Secondary | ICD-10-CM | POA: Diagnosis not present

## 2016-06-18 DIAGNOSIS — B351 Tinea unguium: Secondary | ICD-10-CM | POA: Diagnosis not present

## 2016-06-18 DIAGNOSIS — R1312 Dysphagia, oropharyngeal phase: Secondary | ICD-10-CM | POA: Diagnosis not present

## 2016-06-19 DIAGNOSIS — R2689 Other abnormalities of gait and mobility: Secondary | ICD-10-CM | POA: Diagnosis not present

## 2016-06-19 DIAGNOSIS — R1312 Dysphagia, oropharyngeal phase: Secondary | ICD-10-CM | POA: Diagnosis not present

## 2016-06-19 DIAGNOSIS — R488 Other symbolic dysfunctions: Secondary | ICD-10-CM | POA: Diagnosis not present

## 2016-06-20 DIAGNOSIS — R488 Other symbolic dysfunctions: Secondary | ICD-10-CM | POA: Diagnosis not present

## 2016-06-20 DIAGNOSIS — R1312 Dysphagia, oropharyngeal phase: Secondary | ICD-10-CM | POA: Diagnosis not present

## 2016-06-20 DIAGNOSIS — R2689 Other abnormalities of gait and mobility: Secondary | ICD-10-CM | POA: Diagnosis not present

## 2016-06-22 DIAGNOSIS — R1312 Dysphagia, oropharyngeal phase: Secondary | ICD-10-CM | POA: Diagnosis not present

## 2016-06-22 DIAGNOSIS — R2689 Other abnormalities of gait and mobility: Secondary | ICD-10-CM | POA: Diagnosis not present

## 2016-06-22 DIAGNOSIS — R488 Other symbolic dysfunctions: Secondary | ICD-10-CM | POA: Diagnosis not present

## 2016-06-23 DIAGNOSIS — R1312 Dysphagia, oropharyngeal phase: Secondary | ICD-10-CM | POA: Diagnosis not present

## 2016-06-23 DIAGNOSIS — R488 Other symbolic dysfunctions: Secondary | ICD-10-CM | POA: Diagnosis not present

## 2016-06-23 DIAGNOSIS — R2689 Other abnormalities of gait and mobility: Secondary | ICD-10-CM | POA: Diagnosis not present

## 2016-06-24 DIAGNOSIS — R488 Other symbolic dysfunctions: Secondary | ICD-10-CM | POA: Diagnosis not present

## 2016-06-24 DIAGNOSIS — R1312 Dysphagia, oropharyngeal phase: Secondary | ICD-10-CM | POA: Diagnosis not present

## 2016-06-24 DIAGNOSIS — R2689 Other abnormalities of gait and mobility: Secondary | ICD-10-CM | POA: Diagnosis not present

## 2016-06-25 DIAGNOSIS — R2689 Other abnormalities of gait and mobility: Secondary | ICD-10-CM | POA: Diagnosis not present

## 2016-06-25 DIAGNOSIS — R1312 Dysphagia, oropharyngeal phase: Secondary | ICD-10-CM | POA: Diagnosis not present

## 2016-06-25 DIAGNOSIS — R488 Other symbolic dysfunctions: Secondary | ICD-10-CM | POA: Diagnosis not present

## 2016-07-24 DEATH — deceased

## 2018-01-11 IMAGING — CT CT HEAD W/O CM
3 of 4 series · 15 of 47 positions shown, 18 images · non-contrast
Comparison: None.

CLINICAL DATA: Altered mental status with incontinence and
confusion

EXAM:
CT HEAD WITHOUT CONTRAST
TECHNIQUE: Contiguous axial images were obtained from the base of the skull
through the vertex without intravenous contrast.

[Series 4: head 2mm · axial · 0.43mm/px · z∈[-90,+24]mm · 9 of 79 slices shown, 12 images]
[im 8/79  brain]
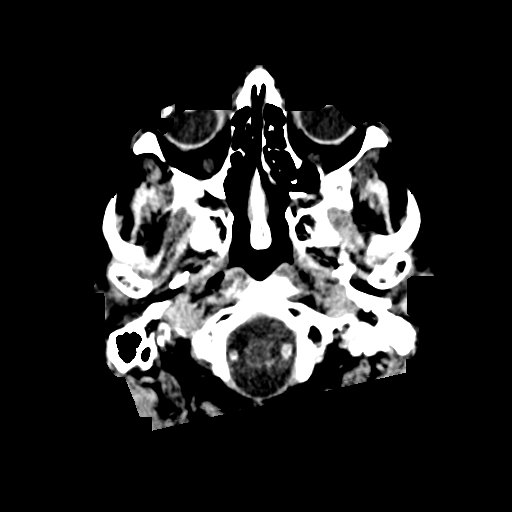
[im 8/79  bone]
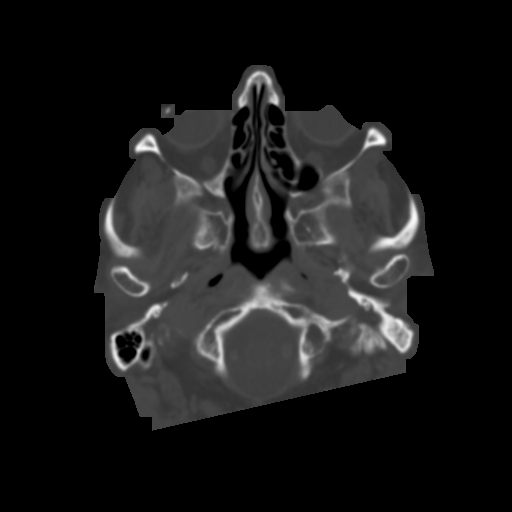
[im 16/79  brain]
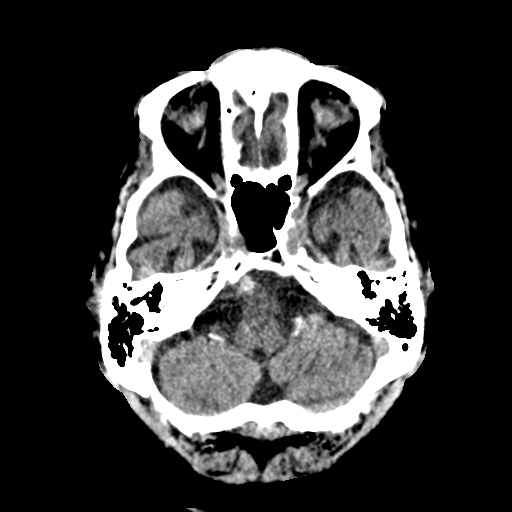
[im 24/79  brain]
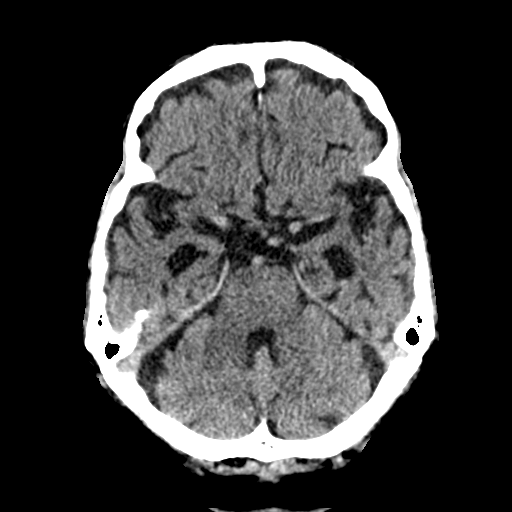
[im 32/79  brain]
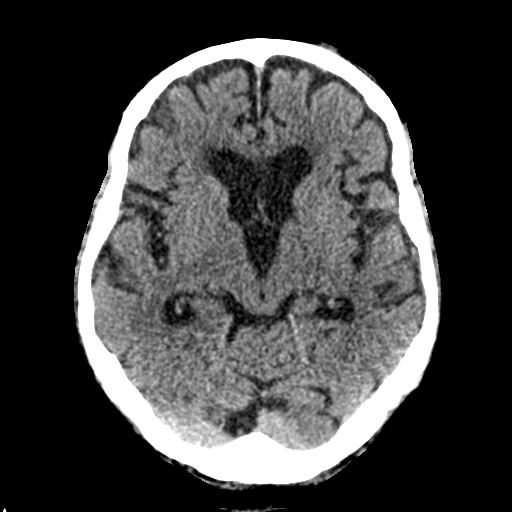
[im 40/79  brain]
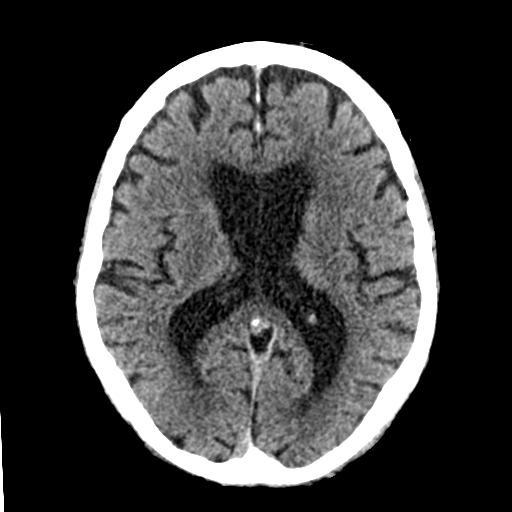
[im 40/79  bone]
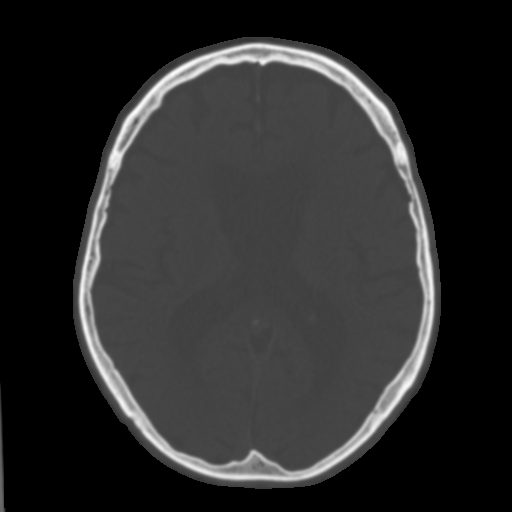
[im 47/79  brain]
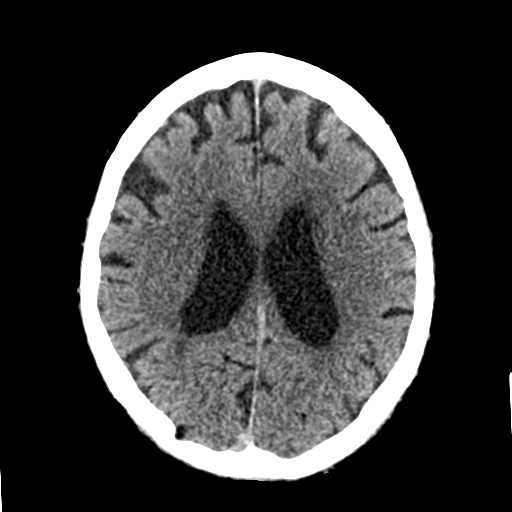
[im 55/79  brain]
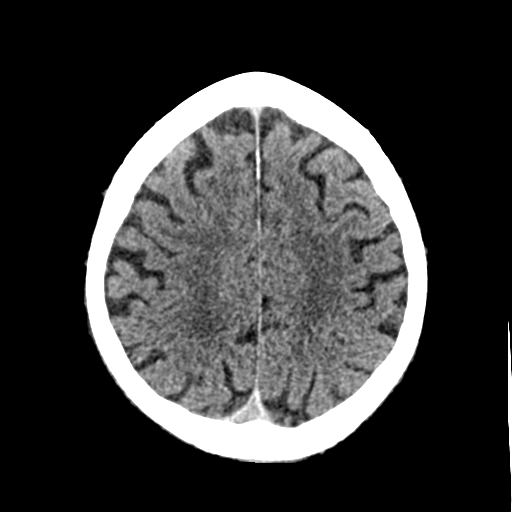
[im 63/79  brain]
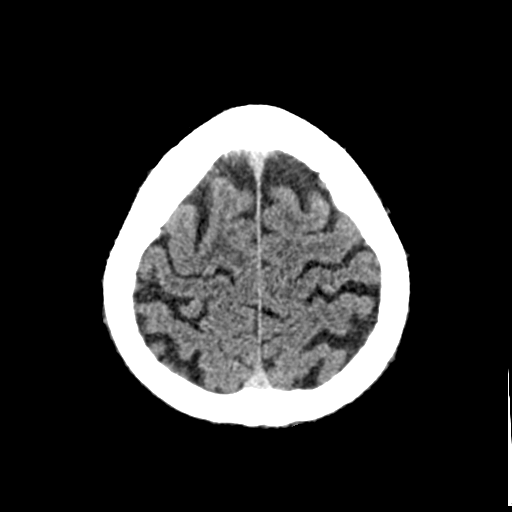
[im 71/79  brain]
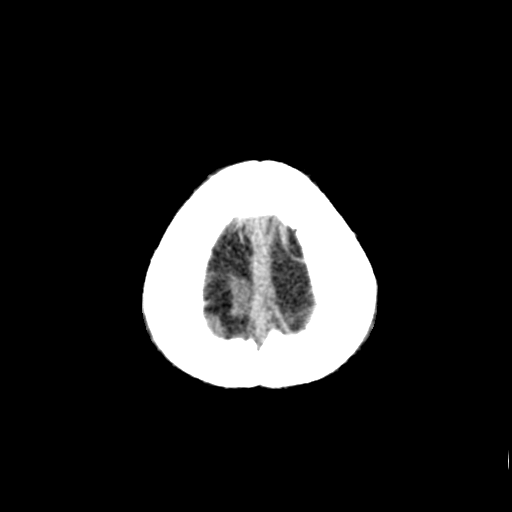
[im 71/79  bone]
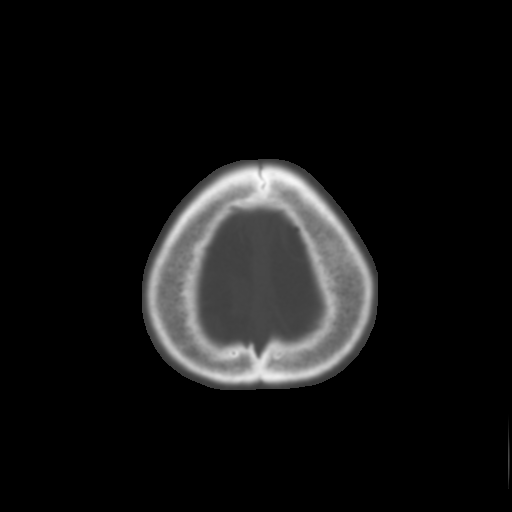

[Series 5: head 3.0 mpr · coronal · 0.31mm/px · 3 of 74 slices shown (1 of 2)]
[im 27/74  brain]
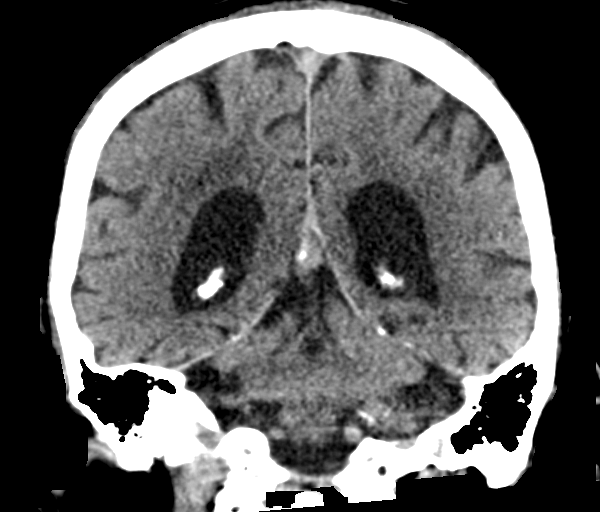
[im 34/74  brain]
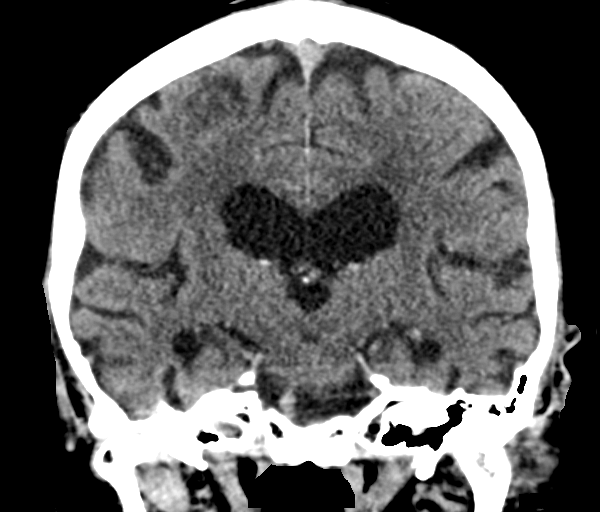
[im 41/74  brain]
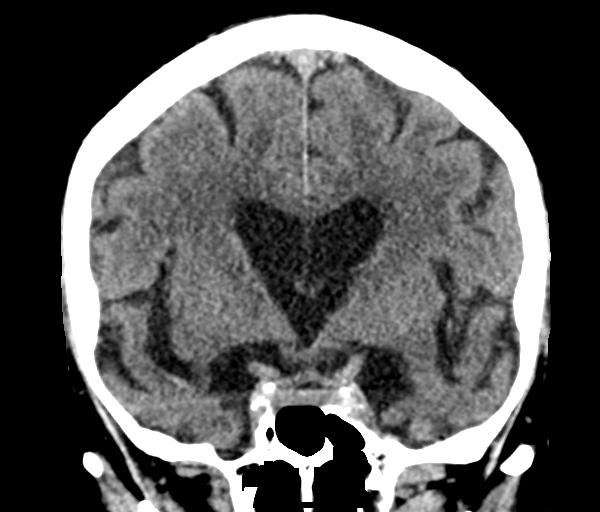

[Series 6: head 3.0 mpr · sagittal · 0.31mm/px · 3 of 54 slices shown (2 of 2)]
[im 18/54  brain]
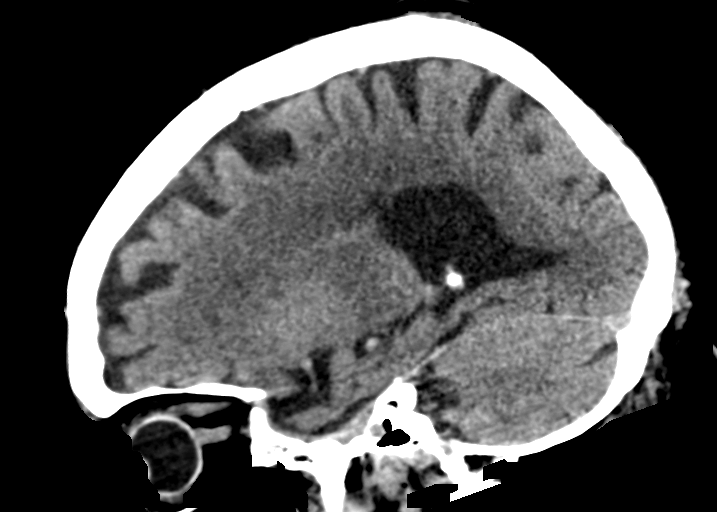
[im 27/54  brain]
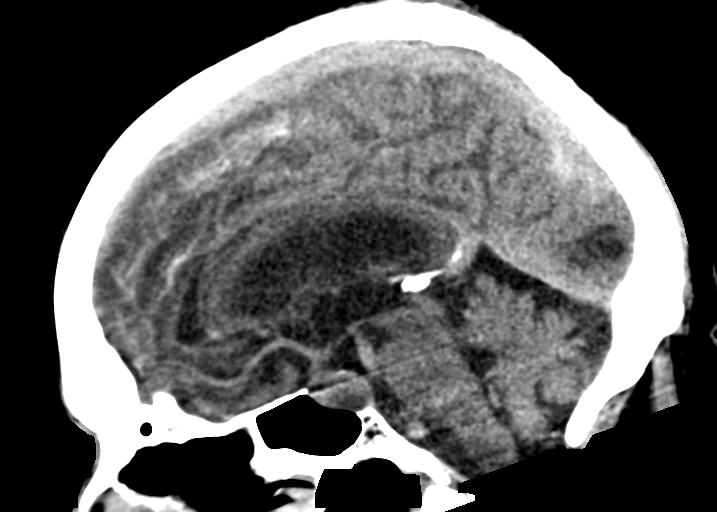
[im 36/54  brain]
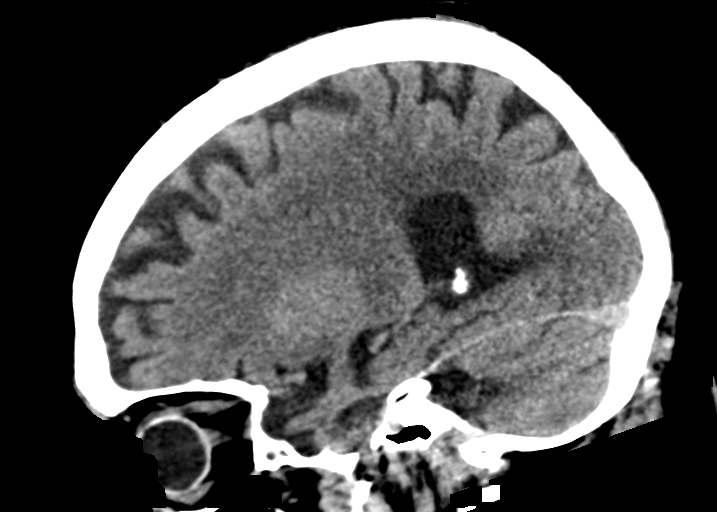

[15 of 47 positions shown; findings below may reference images not displayed]

FINDINGS: Brain: There is moderate diffuse atrophy. There is no intracranial
mass, hemorrhage, extra-axial fluid collection, or midline shift.
There is patchy small vessel disease in the centra semiovale
bilaterally. No acute infarct is evident.

Vascular: The middle cerebral artery show symmetric attenuation
bilaterally. No hyperdense vessel is evident. There are foci of
calcification in each distal vertebral artery. There is also
calcification in each carotid siphon.

Skull: The bony calvarium appears intact.

Sinuses/Orbits: There is mucosal thickening in several ethmoid air
cells bilaterally. Frontal sinuses are nearly aplastic. Visualized
paranasal sinuses elsewhere clear. Orbits appear symmetric
bilaterally.

Other: Mastoid air cells are clear.
IMPRESSION: Moderate atrophy with patchy periventricular small vessel disease.
No acute infarct evident. No hemorrhage or mass effect.

There are areas of paranasal sinus disease. There are foci of
arterial vascular calcification.

## 2018-01-11 IMAGING — CR DG PELVIS 1-2V
1 series · 1 of 1 positions shown · non-contrast
Comparison: None.

CLINICAL DATA: Found in floor in living room, alert, but more
confused than usual, incontinent of urine and stool. EMS called to
get him up. Does not remember what happened. Denies any pain.

EXAM:
PELVIS - 1-2 VIEW

[pelvis ap]
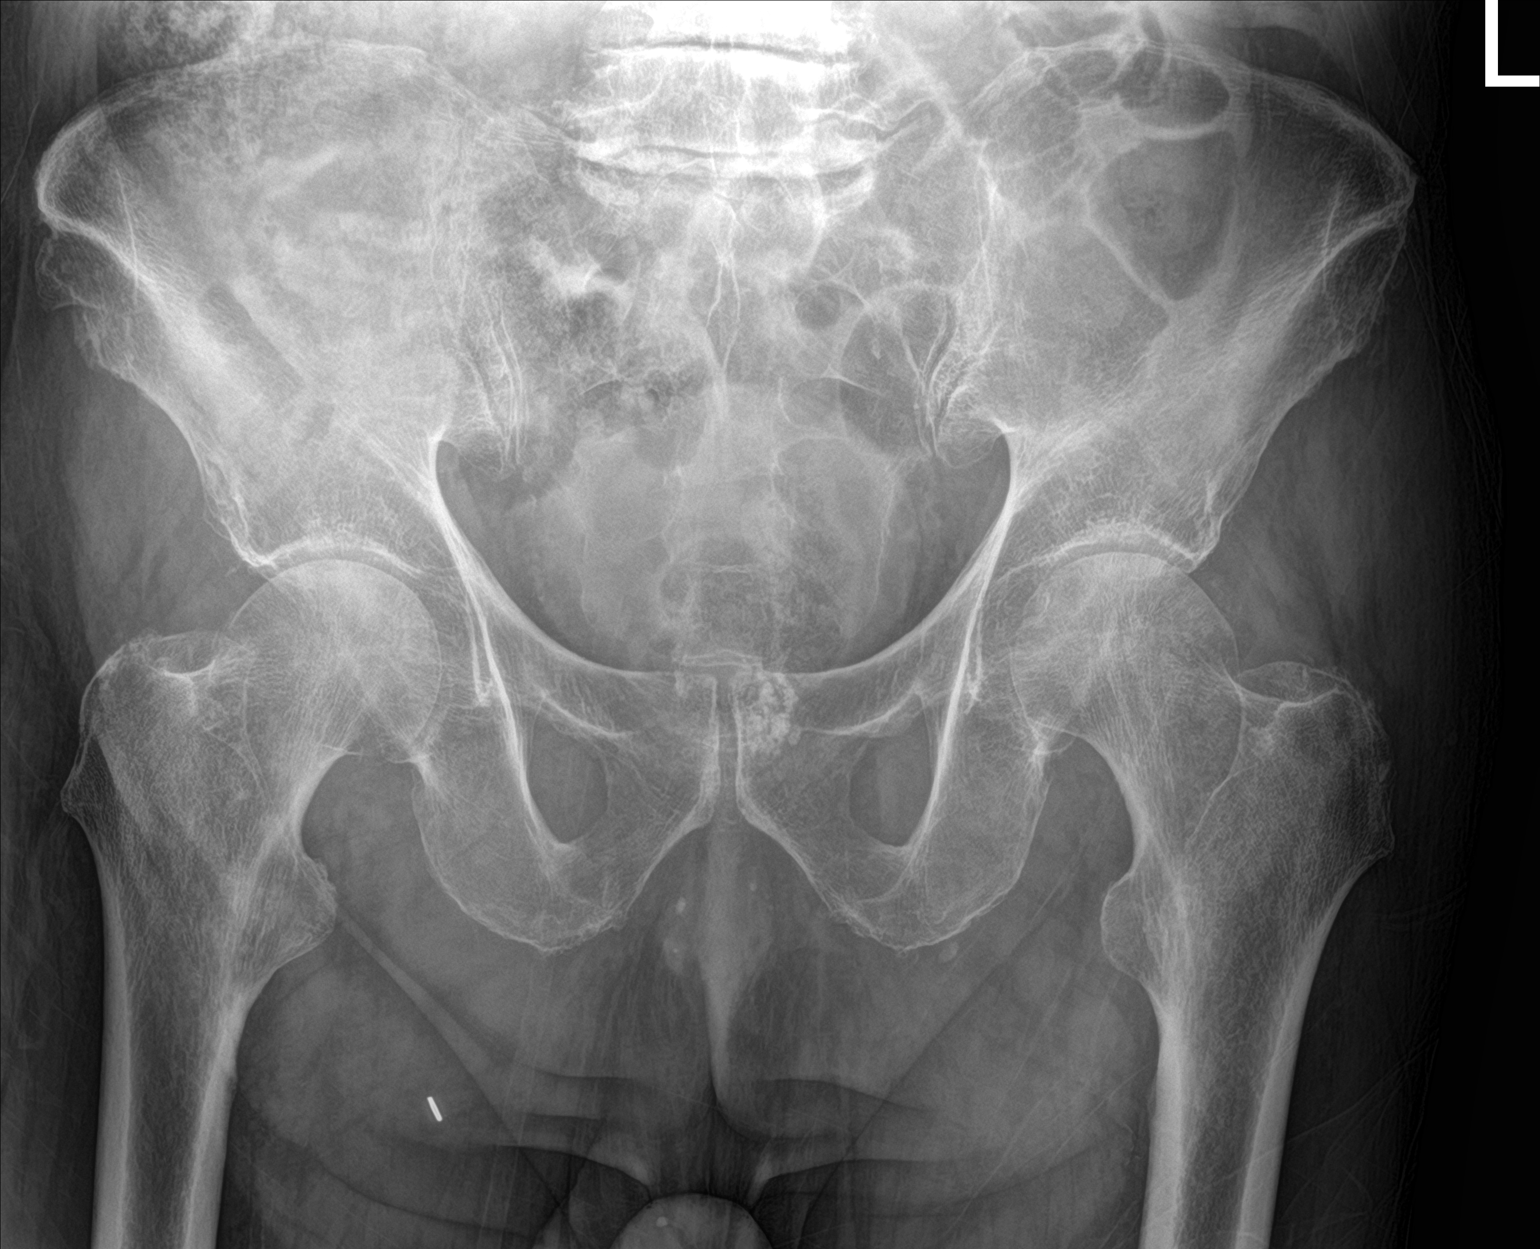

[1 of 1 positions shown; findings below may reference images not displayed]

FINDINGS: There is no evidence of pelvic fracture or diastasis. No pelvic bone
lesions are seen. Degenerative disc disease in the visualized lower
lumbar spine.
IMPRESSION: 1. Negative pelvis.
2. Lower lumbar degenerative disc disease.
